# Patient Record
Sex: Female | Born: 1950 | Race: White | Hispanic: No | Smoking: Never smoker
Health system: Southern US, Community
[De-identification: ages and names within clinical notes are randomized; demographics above are authoritative.]

## PROBLEM LIST (undated history)

## (undated) DIAGNOSIS — M1990 Unspecified osteoarthritis, unspecified site: Secondary | ICD-10-CM

## (undated) DIAGNOSIS — D649 Anemia, unspecified: Secondary | ICD-10-CM

## (undated) DIAGNOSIS — F329 Major depressive disorder, single episode, unspecified: Secondary | ICD-10-CM

## (undated) DIAGNOSIS — F419 Anxiety disorder, unspecified: Secondary | ICD-10-CM

## (undated) DIAGNOSIS — J45909 Unspecified asthma, uncomplicated: Secondary | ICD-10-CM

## (undated) DIAGNOSIS — C801 Malignant (primary) neoplasm, unspecified: Secondary | ICD-10-CM

## (undated) DIAGNOSIS — G4730 Sleep apnea, unspecified: Secondary | ICD-10-CM

## (undated) DIAGNOSIS — K529 Noninfective gastroenteritis and colitis, unspecified: Secondary | ICD-10-CM

## (undated) DIAGNOSIS — K219 Gastro-esophageal reflux disease without esophagitis: Secondary | ICD-10-CM

## (undated) DIAGNOSIS — T8859XA Other complications of anesthesia, initial encounter: Secondary | ICD-10-CM

## (undated) DIAGNOSIS — F32A Depression, unspecified: Secondary | ICD-10-CM

## (undated) DIAGNOSIS — T4145XA Adverse effect of unspecified anesthetic, initial encounter: Secondary | ICD-10-CM

## (undated) HISTORY — PX: TUBAL LIGATION: SHX77

## (undated) HISTORY — DX: Malignant (primary) neoplasm, unspecified: C80.1

## (undated) HISTORY — PX: EYE SURGERY: SHX253

## (undated) HISTORY — PX: OTHER SURGICAL HISTORY: SHX169

## (undated) HISTORY — DX: Noninfective gastroenteritis and colitis, unspecified: K52.9

---

## 2004-07-28 DIAGNOSIS — C801 Malignant (primary) neoplasm, unspecified: Secondary | ICD-10-CM

## 2004-07-28 HISTORY — DX: Malignant (primary) neoplasm, unspecified: C80.1

## 2011-09-24 DIAGNOSIS — Z8551 Personal history of malignant neoplasm of bladder: Secondary | ICD-10-CM | POA: Insufficient documentation

## 2012-04-07 DIAGNOSIS — L719 Rosacea, unspecified: Secondary | ICD-10-CM | POA: Insufficient documentation

## 2013-11-02 DIAGNOSIS — H18602 Keratoconus, unspecified, left eye: Secondary | ICD-10-CM | POA: Insufficient documentation

## 2013-11-02 DIAGNOSIS — Z961 Presence of intraocular lens: Secondary | ICD-10-CM | POA: Insufficient documentation

## 2013-11-02 DIAGNOSIS — Z947 Corneal transplant status: Secondary | ICD-10-CM | POA: Insufficient documentation

## 2013-11-02 DIAGNOSIS — H2511 Age-related nuclear cataract, right eye: Secondary | ICD-10-CM | POA: Insufficient documentation

## 2013-11-02 DIAGNOSIS — H04129 Dry eye syndrome of unspecified lacrimal gland: Secondary | ICD-10-CM | POA: Insufficient documentation

## 2014-10-07 DIAGNOSIS — Z8719 Personal history of other diseases of the digestive system: Secondary | ICD-10-CM | POA: Insufficient documentation

## 2014-10-07 DIAGNOSIS — K2960 Other gastritis without bleeding: Secondary | ICD-10-CM | POA: Insufficient documentation

## 2015-01-17 DIAGNOSIS — K2100 Gastro-esophageal reflux disease with esophagitis, without bleeding: Secondary | ICD-10-CM | POA: Insufficient documentation

## 2015-02-05 LAB — HM DEXA SCAN

## 2015-03-23 LAB — HM COLONOSCOPY

## 2015-03-30 DIAGNOSIS — K648 Other hemorrhoids: Secondary | ICD-10-CM | POA: Insufficient documentation

## 2015-07-29 HISTORY — PX: OTHER SURGICAL HISTORY: SHX169

## 2015-07-29 HISTORY — PX: JOINT REPLACEMENT: SHX530

## 2015-11-05 DIAGNOSIS — M1712 Unilateral primary osteoarthritis, left knee: Secondary | ICD-10-CM | POA: Diagnosis not present

## 2015-11-05 DIAGNOSIS — M25562 Pain in left knee: Secondary | ICD-10-CM | POA: Diagnosis not present

## 2015-11-05 DIAGNOSIS — M25561 Pain in right knee: Secondary | ICD-10-CM | POA: Diagnosis not present

## 2015-11-05 DIAGNOSIS — M7051 Other bursitis of knee, right knee: Secondary | ICD-10-CM | POA: Diagnosis not present

## 2015-11-30 DIAGNOSIS — M25561 Pain in right knee: Secondary | ICD-10-CM | POA: Diagnosis not present

## 2015-12-11 DIAGNOSIS — M47817 Spondylosis without myelopathy or radiculopathy, lumbosacral region: Secondary | ICD-10-CM | POA: Diagnosis not present

## 2015-12-11 DIAGNOSIS — Z96641 Presence of right artificial hip joint: Secondary | ICD-10-CM | POA: Diagnosis not present

## 2015-12-11 DIAGNOSIS — Z96651 Presence of right artificial knee joint: Secondary | ICD-10-CM | POA: Diagnosis not present

## 2015-12-11 DIAGNOSIS — M25561 Pain in right knee: Secondary | ICD-10-CM | POA: Diagnosis not present

## 2016-01-07 DIAGNOSIS — Z96651 Presence of right artificial knee joint: Secondary | ICD-10-CM | POA: Diagnosis not present

## 2016-01-07 DIAGNOSIS — T8484XD Pain due to internal orthopedic prosthetic devices, implants and grafts, subsequent encounter: Secondary | ICD-10-CM | POA: Diagnosis not present

## 2016-01-07 DIAGNOSIS — M25561 Pain in right knee: Secondary | ICD-10-CM | POA: Diagnosis not present

## 2016-01-17 DIAGNOSIS — J45909 Unspecified asthma, uncomplicated: Secondary | ICD-10-CM | POA: Diagnosis not present

## 2016-01-17 DIAGNOSIS — M419 Scoliosis, unspecified: Secondary | ICD-10-CM | POA: Diagnosis not present

## 2016-01-17 DIAGNOSIS — Z01818 Encounter for other preprocedural examination: Secondary | ICD-10-CM | POA: Diagnosis not present

## 2016-01-17 DIAGNOSIS — F329 Major depressive disorder, single episode, unspecified: Secondary | ICD-10-CM | POA: Diagnosis not present

## 2016-01-17 DIAGNOSIS — E669 Obesity, unspecified: Secondary | ICD-10-CM | POA: Diagnosis not present

## 2016-01-17 DIAGNOSIS — Z01812 Encounter for preprocedural laboratory examination: Secondary | ICD-10-CM | POA: Diagnosis not present

## 2016-01-17 DIAGNOSIS — G4730 Sleep apnea, unspecified: Secondary | ICD-10-CM | POA: Diagnosis not present

## 2016-01-17 DIAGNOSIS — K219 Gastro-esophageal reflux disease without esophagitis: Secondary | ICD-10-CM | POA: Diagnosis not present

## 2016-01-21 DIAGNOSIS — Z96659 Presence of unspecified artificial knee joint: Secondary | ICD-10-CM | POA: Diagnosis not present

## 2016-01-21 DIAGNOSIS — T8459XS Infection and inflammatory reaction due to other internal joint prosthesis, sequela: Secondary | ICD-10-CM | POA: Diagnosis not present

## 2016-01-21 DIAGNOSIS — J45909 Unspecified asthma, uncomplicated: Secondary | ICD-10-CM | POA: Diagnosis not present

## 2016-01-21 DIAGNOSIS — Z96651 Presence of right artificial knee joint: Secondary | ICD-10-CM | POA: Diagnosis not present

## 2016-01-21 DIAGNOSIS — T8484XD Pain due to internal orthopedic prosthetic devices, implants and grafts, subsequent encounter: Secondary | ICD-10-CM | POA: Diagnosis not present

## 2016-01-22 DIAGNOSIS — J45909 Unspecified asthma, uncomplicated: Secondary | ICD-10-CM | POA: Diagnosis present

## 2016-01-22 DIAGNOSIS — E7800 Pure hypercholesterolemia, unspecified: Secondary | ICD-10-CM | POA: Diagnosis present

## 2016-01-22 DIAGNOSIS — K210 Gastro-esophageal reflux disease with esophagitis: Secondary | ICD-10-CM | POA: Diagnosis present

## 2016-01-22 DIAGNOSIS — G4710 Hypersomnia, unspecified: Secondary | ICD-10-CM | POA: Diagnosis present

## 2016-01-22 DIAGNOSIS — Z880 Allergy status to penicillin: Secondary | ICD-10-CM | POA: Diagnosis not present

## 2016-01-22 DIAGNOSIS — E669 Obesity, unspecified: Secondary | ICD-10-CM | POA: Diagnosis present

## 2016-01-22 DIAGNOSIS — T84092A Other mechanical complication of internal right knee prosthesis, initial encounter: Secondary | ICD-10-CM | POA: Diagnosis not present

## 2016-01-22 DIAGNOSIS — Z96651 Presence of right artificial knee joint: Secondary | ICD-10-CM | POA: Diagnosis not present

## 2016-01-22 DIAGNOSIS — D509 Iron deficiency anemia, unspecified: Secondary | ICD-10-CM | POA: Diagnosis present

## 2016-01-22 DIAGNOSIS — G4733 Obstructive sleep apnea (adult) (pediatric): Secondary | ICD-10-CM | POA: Diagnosis present

## 2016-01-22 DIAGNOSIS — Z881 Allergy status to other antibiotic agents status: Secondary | ICD-10-CM | POA: Diagnosis not present

## 2016-01-22 DIAGNOSIS — Z9889 Other specified postprocedural states: Secondary | ICD-10-CM | POA: Diagnosis not present

## 2016-01-22 DIAGNOSIS — Z6832 Body mass index (BMI) 32.0-32.9, adult: Secondary | ICD-10-CM | POA: Diagnosis not present

## 2016-01-22 DIAGNOSIS — Z8551 Personal history of malignant neoplasm of bladder: Secondary | ICD-10-CM | POA: Diagnosis not present

## 2016-01-22 DIAGNOSIS — F329 Major depressive disorder, single episode, unspecified: Secondary | ICD-10-CM | POA: Diagnosis present

## 2016-01-22 DIAGNOSIS — Z471 Aftercare following joint replacement surgery: Secondary | ICD-10-CM | POA: Diagnosis not present

## 2016-01-22 DIAGNOSIS — Z79891 Long term (current) use of opiate analgesic: Secondary | ICD-10-CM | POA: Diagnosis not present

## 2016-01-22 DIAGNOSIS — Z79899 Other long term (current) drug therapy: Secondary | ICD-10-CM | POA: Diagnosis not present

## 2016-01-22 DIAGNOSIS — Z888 Allergy status to other drugs, medicaments and biological substances status: Secondary | ICD-10-CM | POA: Diagnosis not present

## 2016-01-22 DIAGNOSIS — T84032A Mechanical loosening of internal right knee prosthetic joint, initial encounter: Secondary | ICD-10-CM | POA: Diagnosis not present

## 2016-01-22 DIAGNOSIS — T8484XS Pain due to internal orthopedic prosthetic devices, implants and grafts, sequela: Secondary | ICD-10-CM | POA: Diagnosis not present

## 2016-01-26 DIAGNOSIS — J45909 Unspecified asthma, uncomplicated: Secondary | ICD-10-CM | POA: Diagnosis not present

## 2016-01-26 DIAGNOSIS — Z96651 Presence of right artificial knee joint: Secondary | ICD-10-CM | POA: Diagnosis not present

## 2016-01-26 DIAGNOSIS — E663 Overweight: Secondary | ICD-10-CM | POA: Diagnosis not present

## 2016-01-26 DIAGNOSIS — Z9181 History of falling: Secondary | ICD-10-CM | POA: Diagnosis not present

## 2016-01-26 DIAGNOSIS — Z4733 Aftercare following explantation of knee joint prosthesis: Secondary | ICD-10-CM | POA: Diagnosis not present

## 2016-01-26 DIAGNOSIS — Z6830 Body mass index (BMI) 30.0-30.9, adult: Secondary | ICD-10-CM | POA: Diagnosis not present

## 2016-01-26 DIAGNOSIS — M1712 Unilateral primary osteoarthritis, left knee: Secondary | ICD-10-CM | POA: Diagnosis not present

## 2016-01-26 DIAGNOSIS — F329 Major depressive disorder, single episode, unspecified: Secondary | ICD-10-CM | POA: Diagnosis not present

## 2016-01-26 DIAGNOSIS — Z4801 Encounter for change or removal of surgical wound dressing: Secondary | ICD-10-CM | POA: Diagnosis not present

## 2016-01-27 DIAGNOSIS — Z4801 Encounter for change or removal of surgical wound dressing: Secondary | ICD-10-CM | POA: Diagnosis not present

## 2016-01-27 DIAGNOSIS — E663 Overweight: Secondary | ICD-10-CM | POA: Diagnosis not present

## 2016-01-27 DIAGNOSIS — M1712 Unilateral primary osteoarthritis, left knee: Secondary | ICD-10-CM | POA: Diagnosis not present

## 2016-01-27 DIAGNOSIS — Z4733 Aftercare following explantation of knee joint prosthesis: Secondary | ICD-10-CM | POA: Diagnosis not present

## 2016-01-27 DIAGNOSIS — J45909 Unspecified asthma, uncomplicated: Secondary | ICD-10-CM | POA: Diagnosis not present

## 2016-01-27 DIAGNOSIS — F329 Major depressive disorder, single episode, unspecified: Secondary | ICD-10-CM | POA: Diagnosis not present

## 2016-02-01 DIAGNOSIS — Z4733 Aftercare following explantation of knee joint prosthesis: Secondary | ICD-10-CM | POA: Diagnosis not present

## 2016-02-01 DIAGNOSIS — F329 Major depressive disorder, single episode, unspecified: Secondary | ICD-10-CM | POA: Diagnosis not present

## 2016-02-01 DIAGNOSIS — J45909 Unspecified asthma, uncomplicated: Secondary | ICD-10-CM | POA: Diagnosis not present

## 2016-02-01 DIAGNOSIS — E663 Overweight: Secondary | ICD-10-CM | POA: Diagnosis not present

## 2016-02-01 DIAGNOSIS — M1712 Unilateral primary osteoarthritis, left knee: Secondary | ICD-10-CM | POA: Diagnosis not present

## 2016-02-01 DIAGNOSIS — Z4801 Encounter for change or removal of surgical wound dressing: Secondary | ICD-10-CM | POA: Diagnosis not present

## 2016-02-04 DIAGNOSIS — J45909 Unspecified asthma, uncomplicated: Secondary | ICD-10-CM | POA: Diagnosis not present

## 2016-02-04 DIAGNOSIS — E663 Overweight: Secondary | ICD-10-CM | POA: Diagnosis not present

## 2016-02-04 DIAGNOSIS — Z4801 Encounter for change or removal of surgical wound dressing: Secondary | ICD-10-CM | POA: Diagnosis not present

## 2016-02-04 DIAGNOSIS — F329 Major depressive disorder, single episode, unspecified: Secondary | ICD-10-CM | POA: Diagnosis not present

## 2016-02-04 DIAGNOSIS — Z4733 Aftercare following explantation of knee joint prosthesis: Secondary | ICD-10-CM | POA: Diagnosis not present

## 2016-02-04 DIAGNOSIS — M1712 Unilateral primary osteoarthritis, left knee: Secondary | ICD-10-CM | POA: Diagnosis not present

## 2016-02-04 DIAGNOSIS — M25561 Pain in right knee: Secondary | ICD-10-CM | POA: Diagnosis not present

## 2016-02-07 DIAGNOSIS — Z1239 Encounter for other screening for malignant neoplasm of breast: Secondary | ICD-10-CM | POA: Diagnosis not present

## 2016-02-07 DIAGNOSIS — F324 Major depressive disorder, single episode, in partial remission: Secondary | ICD-10-CM | POA: Diagnosis not present

## 2016-02-07 DIAGNOSIS — G4700 Insomnia, unspecified: Secondary | ICD-10-CM | POA: Diagnosis not present

## 2016-02-07 DIAGNOSIS — Z119 Encounter for screening for infectious and parasitic diseases, unspecified: Secondary | ICD-10-CM | POA: Diagnosis not present

## 2016-02-07 DIAGNOSIS — E7800 Pure hypercholesterolemia, unspecified: Secondary | ICD-10-CM | POA: Diagnosis not present

## 2016-02-07 DIAGNOSIS — D509 Iron deficiency anemia, unspecified: Secondary | ICD-10-CM | POA: Diagnosis not present

## 2016-02-07 DIAGNOSIS — R739 Hyperglycemia, unspecified: Secondary | ICD-10-CM | POA: Diagnosis not present

## 2016-02-07 DIAGNOSIS — T8459XS Infection and inflammatory reaction due to other internal joint prosthesis, sequela: Secondary | ICD-10-CM | POA: Diagnosis not present

## 2016-02-07 DIAGNOSIS — Z96659 Presence of unspecified artificial knee joint: Secondary | ICD-10-CM | POA: Diagnosis not present

## 2016-02-07 DIAGNOSIS — Z79899 Other long term (current) drug therapy: Secondary | ICD-10-CM | POA: Diagnosis not present

## 2016-02-08 DIAGNOSIS — Z79899 Other long term (current) drug therapy: Secondary | ICD-10-CM | POA: Diagnosis not present

## 2016-02-08 DIAGNOSIS — E663 Overweight: Secondary | ICD-10-CM | POA: Diagnosis not present

## 2016-02-08 DIAGNOSIS — D509 Iron deficiency anemia, unspecified: Secondary | ICD-10-CM | POA: Diagnosis not present

## 2016-02-08 DIAGNOSIS — Z4733 Aftercare following explantation of knee joint prosthesis: Secondary | ICD-10-CM | POA: Diagnosis not present

## 2016-02-08 DIAGNOSIS — Z4801 Encounter for change or removal of surgical wound dressing: Secondary | ICD-10-CM | POA: Diagnosis not present

## 2016-02-08 DIAGNOSIS — R739 Hyperglycemia, unspecified: Secondary | ICD-10-CM | POA: Diagnosis not present

## 2016-02-08 DIAGNOSIS — J45909 Unspecified asthma, uncomplicated: Secondary | ICD-10-CM | POA: Diagnosis not present

## 2016-02-08 DIAGNOSIS — M1712 Unilateral primary osteoarthritis, left knee: Secondary | ICD-10-CM | POA: Diagnosis not present

## 2016-02-08 DIAGNOSIS — Z119 Encounter for screening for infectious and parasitic diseases, unspecified: Secondary | ICD-10-CM | POA: Diagnosis not present

## 2016-02-08 DIAGNOSIS — F329 Major depressive disorder, single episode, unspecified: Secondary | ICD-10-CM | POA: Diagnosis not present

## 2016-02-13 DIAGNOSIS — Z96651 Presence of right artificial knee joint: Secondary | ICD-10-CM | POA: Diagnosis not present

## 2016-02-13 DIAGNOSIS — M25561 Pain in right knee: Secondary | ICD-10-CM | POA: Diagnosis not present

## 2016-02-13 DIAGNOSIS — M6281 Muscle weakness (generalized): Secondary | ICD-10-CM | POA: Diagnosis not present

## 2016-03-17 DIAGNOSIS — M25561 Pain in right knee: Secondary | ICD-10-CM | POA: Diagnosis not present

## 2016-03-17 DIAGNOSIS — Z96651 Presence of right artificial knee joint: Secondary | ICD-10-CM | POA: Diagnosis not present

## 2016-03-17 DIAGNOSIS — M1712 Unilateral primary osteoarthritis, left knee: Secondary | ICD-10-CM | POA: Diagnosis not present

## 2016-04-23 DIAGNOSIS — R21 Rash and other nonspecific skin eruption: Secondary | ICD-10-CM | POA: Diagnosis not present

## 2016-04-23 DIAGNOSIS — Z23 Encounter for immunization: Secondary | ICD-10-CM | POA: Diagnosis not present

## 2016-05-29 DIAGNOSIS — M79604 Pain in right leg: Secondary | ICD-10-CM | POA: Diagnosis not present

## 2016-05-30 DIAGNOSIS — M25562 Pain in left knee: Secondary | ICD-10-CM | POA: Diagnosis not present

## 2016-05-30 DIAGNOSIS — Z5189 Encounter for other specified aftercare: Secondary | ICD-10-CM | POA: Diagnosis not present

## 2016-05-30 DIAGNOSIS — M25561 Pain in right knee: Secondary | ICD-10-CM | POA: Diagnosis not present

## 2016-10-08 DIAGNOSIS — E611 Iron deficiency: Secondary | ICD-10-CM | POA: Diagnosis not present

## 2016-10-08 DIAGNOSIS — Z79899 Other long term (current) drug therapy: Secondary | ICD-10-CM | POA: Diagnosis not present

## 2016-10-08 DIAGNOSIS — R739 Hyperglycemia, unspecified: Secondary | ICD-10-CM | POA: Diagnosis not present

## 2016-10-08 DIAGNOSIS — E7800 Pure hypercholesterolemia, unspecified: Secondary | ICD-10-CM | POA: Diagnosis not present

## 2016-10-15 DIAGNOSIS — H9190 Unspecified hearing loss, unspecified ear: Secondary | ICD-10-CM | POA: Diagnosis not present

## 2016-10-15 DIAGNOSIS — F324 Major depressive disorder, single episode, in partial remission: Secondary | ICD-10-CM | POA: Diagnosis not present

## 2016-10-15 DIAGNOSIS — Z1231 Encounter for screening mammogram for malignant neoplasm of breast: Secondary | ICD-10-CM | POA: Diagnosis not present

## 2016-10-15 DIAGNOSIS — E7800 Pure hypercholesterolemia, unspecified: Secondary | ICD-10-CM | POA: Diagnosis not present

## 2016-10-15 DIAGNOSIS — R21 Rash and other nonspecific skin eruption: Secondary | ICD-10-CM | POA: Diagnosis not present

## 2016-10-15 DIAGNOSIS — E611 Iron deficiency: Secondary | ICD-10-CM | POA: Diagnosis not present

## 2016-10-28 DIAGNOSIS — F331 Major depressive disorder, recurrent, moderate: Secondary | ICD-10-CM | POA: Diagnosis not present

## 2016-11-12 DIAGNOSIS — F331 Major depressive disorder, recurrent, moderate: Secondary | ICD-10-CM | POA: Diagnosis not present

## 2016-11-25 DIAGNOSIS — F331 Major depressive disorder, recurrent, moderate: Secondary | ICD-10-CM | POA: Diagnosis not present

## 2016-12-01 DIAGNOSIS — Z1231 Encounter for screening mammogram for malignant neoplasm of breast: Secondary | ICD-10-CM | POA: Diagnosis not present

## 2016-12-09 DIAGNOSIS — F331 Major depressive disorder, recurrent, moderate: Secondary | ICD-10-CM | POA: Diagnosis not present

## 2016-12-25 DIAGNOSIS — Z8551 Personal history of malignant neoplasm of bladder: Secondary | ICD-10-CM | POA: Diagnosis not present

## 2016-12-25 DIAGNOSIS — Z0000 Encounter for general adult medical examination without abnormal findings: Secondary | ICD-10-CM | POA: Diagnosis not present

## 2016-12-30 DIAGNOSIS — F331 Major depressive disorder, recurrent, moderate: Secondary | ICD-10-CM | POA: Diagnosis not present

## 2017-01-08 DIAGNOSIS — F321 Major depressive disorder, single episode, moderate: Secondary | ICD-10-CM | POA: Diagnosis not present

## 2017-01-08 DIAGNOSIS — M79675 Pain in left toe(s): Secondary | ICD-10-CM | POA: Diagnosis not present

## 2017-01-08 DIAGNOSIS — M79674 Pain in right toe(s): Secondary | ICD-10-CM | POA: Diagnosis not present

## 2017-01-08 DIAGNOSIS — F331 Major depressive disorder, recurrent, moderate: Secondary | ICD-10-CM | POA: Diagnosis not present

## 2017-01-08 DIAGNOSIS — F5104 Psychophysiologic insomnia: Secondary | ICD-10-CM | POA: Diagnosis not present

## 2017-01-08 DIAGNOSIS — K591 Functional diarrhea: Secondary | ICD-10-CM | POA: Diagnosis not present

## 2017-01-29 DIAGNOSIS — L602 Onychogryphosis: Secondary | ICD-10-CM | POA: Diagnosis not present

## 2017-01-29 DIAGNOSIS — M205X2 Other deformities of toe(s) (acquired), left foot: Secondary | ICD-10-CM | POA: Diagnosis not present

## 2017-01-29 DIAGNOSIS — M2142 Flat foot [pes planus] (acquired), left foot: Secondary | ICD-10-CM | POA: Diagnosis not present

## 2017-01-29 DIAGNOSIS — R234 Changes in skin texture: Secondary | ICD-10-CM | POA: Diagnosis not present

## 2017-01-29 DIAGNOSIS — M2012 Hallux valgus (acquired), left foot: Secondary | ICD-10-CM | POA: Diagnosis not present

## 2017-01-29 DIAGNOSIS — M19072 Primary osteoarthritis, left ankle and foot: Secondary | ICD-10-CM | POA: Diagnosis not present

## 2017-01-29 DIAGNOSIS — B351 Tinea unguium: Secondary | ICD-10-CM | POA: Diagnosis not present

## 2017-01-29 DIAGNOSIS — M19079 Primary osteoarthritis, unspecified ankle and foot: Secondary | ICD-10-CM | POA: Diagnosis not present

## 2017-01-30 DIAGNOSIS — B351 Tinea unguium: Secondary | ICD-10-CM | POA: Diagnosis not present

## 2017-02-04 DIAGNOSIS — F331 Major depressive disorder, recurrent, moderate: Secondary | ICD-10-CM | POA: Diagnosis not present

## 2017-02-12 DIAGNOSIS — F331 Major depressive disorder, recurrent, moderate: Secondary | ICD-10-CM | POA: Diagnosis not present

## 2017-03-16 DIAGNOSIS — F331 Major depressive disorder, recurrent, moderate: Secondary | ICD-10-CM | POA: Diagnosis not present

## 2017-03-20 ENCOUNTER — Ambulatory Visit (INDEPENDENT_AMBULATORY_CARE_PROVIDER_SITE_OTHER): Payer: Medicare Other | Admitting: Physician Assistant

## 2017-03-20 ENCOUNTER — Encounter: Payer: Self-pay | Admitting: Physician Assistant

## 2017-03-20 VITALS — BP 126/75 | HR 102 | Resp 16 | Wt 194.8 lb

## 2017-03-20 DIAGNOSIS — F5101 Primary insomnia: Secondary | ICD-10-CM | POA: Diagnosis not present

## 2017-03-20 DIAGNOSIS — K219 Gastro-esophageal reflux disease without esophagitis: Secondary | ICD-10-CM

## 2017-03-20 DIAGNOSIS — D509 Iron deficiency anemia, unspecified: Secondary | ICD-10-CM

## 2017-03-20 DIAGNOSIS — F321 Major depressive disorder, single episode, moderate: Secondary | ICD-10-CM | POA: Diagnosis not present

## 2017-03-20 DIAGNOSIS — C678 Malignant neoplasm of overlapping sites of bladder: Secondary | ICD-10-CM | POA: Diagnosis not present

## 2017-03-20 DIAGNOSIS — R5383 Other fatigue: Secondary | ICD-10-CM | POA: Diagnosis not present

## 2017-03-20 DIAGNOSIS — R946 Abnormal results of thyroid function studies: Secondary | ICD-10-CM

## 2017-03-20 DIAGNOSIS — Z96651 Presence of right artificial knee joint: Secondary | ICD-10-CM | POA: Diagnosis not present

## 2017-03-20 DIAGNOSIS — Z23 Encounter for immunization: Secondary | ICD-10-CM | POA: Diagnosis not present

## 2017-03-20 DIAGNOSIS — Z96659 Presence of unspecified artificial knee joint: Secondary | ICD-10-CM | POA: Insufficient documentation

## 2017-03-20 DIAGNOSIS — C679 Malignant neoplasm of bladder, unspecified: Secondary | ICD-10-CM | POA: Insufficient documentation

## 2017-03-20 DIAGNOSIS — L309 Dermatitis, unspecified: Secondary | ICD-10-CM | POA: Diagnosis not present

## 2017-03-20 DIAGNOSIS — Z780 Asymptomatic menopausal state: Secondary | ICD-10-CM

## 2017-03-20 DIAGNOSIS — M79645 Pain in left finger(s): Secondary | ICD-10-CM

## 2017-03-20 DIAGNOSIS — J4520 Mild intermittent asthma, uncomplicated: Secondary | ICD-10-CM | POA: Diagnosis not present

## 2017-03-20 DIAGNOSIS — R7989 Other specified abnormal findings of blood chemistry: Secondary | ICD-10-CM

## 2017-03-20 DIAGNOSIS — R319 Hematuria, unspecified: Secondary | ICD-10-CM | POA: Insufficient documentation

## 2017-03-20 MED ORDER — ESZOPICLONE 1 MG PO TABS: ORAL_TABLET | ORAL | 0 refills | Status: DC

## 2017-03-20 MED ORDER — DICLOFENAC SODIUM 1 % TD GEL: 4.0000 g | Freq: Four times a day (QID) | TRANSDERMAL | 5 refills | Status: DC

## 2017-03-20 NOTE — Progress Notes (Signed)
Subjective:    Patient ID: Monica Page, female    DOB: December 29, 1950, 66 y.o.   MRN: [id number]  HPI  Pt is a 66 yo female who presents to the clinic to establish care.   .. Active Ambulatory Problems    Diagnosis Date Noted  . Bladder cancer (Monica Page) 03/20/2017  . Hematuria 03/20/2017  . 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 Iron deficiency anemia 03/20/2017  . Post-menopausal 03/20/2017  . Elevated TSH 03/20/2017  . No energy 03/20/2017  . Moderate major depression (Monica Page) 03/20/2017  . Mild intermittent asthma without complication 53/97/6734  . Eczema 03/20/2017  . Gastroesophageal reflux disease 03/20/2017  . Normocytic anemia 03/22/2017   Resolved Ambulatory Problems    Diagnosis Date Noted  . No Resolved Ambulatory Problems   Past Medical History:  Diagnosis Date  . Cancer (Monica Page) 07/28/2004  . Thyroid disease    .Marland Kitchen Family History  Problem Relation Age of Onset  . Alcohol abuse Mother   . Hyperlipidemia Mother   . Hypertension Mother   . Stroke Mother   . Depression Mother   . Aneurysm Mother   . Alcohol abuse Father   . Heart attack Father   . Cancer Father        lung and throat cancer  . Diabetes Maternal Grandmother   . Diabetes Maternal Grandfather    .Marland Kitchen Social History   Social History  . Marital status: Divorced    Spouse name: N/A  . Number of children: N/A  . Years of education: N/A   Occupational History  . Not on file.   Social History Main Topics  . Smoking status: Never Smoker  . Smokeless tobacco: Never Used  . Alcohol use 0.0 oz/week  . Drug use: No  . Sexual activity: No   Other Topics Concern  . Not on file   Social History Narrative  . No narrative on file   Pt had TSh checked a few months ago and was elevated. She wants rechecked to see if normalized.   Pt is having increasing problems sleeping and with depression. She has tried [place] with no relief and trazodone caused achy legs.    She is concerned with her weight she would like help losing it. She is not exercising or eating like she should.   Asthma is controlled at this time.   Hx of IDA. Would like labs to monitor.    Pt is having left thumb pain off and on. She does not remember injury. Not using anything to make better. Feels like getting weaker. Cannot take NSAIDS due to ulcer hx.     Review of Systems  All other systems reviewed and are negative.      Objective:   Physical Exam  Constitutional: She is oriented to person, place, and time. She appears well-developed and well-nourished.  HENT:  Head: Normocephalic and atraumatic.  Cardiovascular: Normal rate, regular rhythm and normal heart sounds.   Pulmonary/Chest: Effort normal and breath sounds normal.  Musculoskeletal:  Normal ROM of left thumb.  Tenderness to palpation over base of left thumb.  No bruising, redness, warmth, swelling.  Strength 5/5.   Neurological: She is alert and oriented to person, place, and time.  Psychiatric: She has a normal mood and affect. Her behavior is normal.          Assessment & Plan:  Marland KitchenMarland KitchenLesia was seen today for establish care.  Diagnoses and all orders for this visit:  Elevated TSH -     TSH  Malignant neoplasm of overlapping sites of bladder (HCC)  Status post total right knee replacement  No energy -     TSH -     CBC with Differential/Platelet  Post-menopausal -     TSH -     CBC with Differential/Platelet  Iron deficiency anemia, unspecified iron deficiency anemia type -     CBC with Differential/Platelet  Primary insomnia  Moderate major depression (HCC)  Mild intermittent asthma without complication  Gastroesophageal reflux disease, esophagitis presence not specified  Eczema, unspecified type  Encounter for immunization -     eszopiclone (LUNESTA) 1 MG TABS tablet; Take 1 to 2 tablets immediately before bedtime -     TSH -     CBC with Differential/Platelet -      diclofenac sodium (VOLTAREN) 1 % GEL; Apply 4 g topically 4 (four) times daily. -     Flu vaccine HIGH DOSE PF   .Marland Kitchen Depression screen PHQ 2/9 03/20/2017  Decreased Interest 1  Down, Depressed, Hopeless 2  PHQ - 2 Score 3  Altered sleeping 3  Tired, decreased energy 2  Change in appetite 2  Feeling bad or failure about yourself  1  Trouble concentrating 1  Moving slowly or fidgety/restless 0  Suicidal thoughts 0  PHQ-9 Score 12   persistent depression. Pt request referral to psych.  Will try lunesta for sleep. Failed trazodone. ambien not effective.   Will get labs for thyroid and IDA follow up.   Pt cannot take NSAIDs. Given gel to use for left thumb. Sounds like OA. Did not have time to evaluate today. Follow up with sports medicine.   NEEDS medicare wellness.

## 2017-03-20 NOTE — Patient Instructions (Signed)
Make appt for left thumb pain.

## 2017-03-21 LAB — CBC WITH DIFFERENTIAL/PLATELET
BASOS PCT: 1 %
Basophils Absolute: 52 cells/uL (ref 0–200)
EOS ABS: 364 {cells}/uL (ref 15–500)
EOS PCT: 7 %
HCT: 36.1 % (ref 35.0–45.0)
HEMOGLOBIN: 11.4 g/dL — AB (ref 11.7–15.5)
LYMPHS ABS: 1352 {cells}/uL (ref 850–3900)
Lymphocytes Relative: 26 %
MCH: 26.5 pg — ABNORMAL LOW (ref 27.0–33.0)
MCHC: 31.6 g/dL — ABNORMAL LOW (ref 32.0–36.0)
MCV: 83.8 fL (ref 80.0–100.0)
MONOS PCT: 10 %
MPV: 9 fL (ref 7.5–12.5)
Monocytes Absolute: 520 cells/uL (ref 200–950)
NEUTROS ABS: 2912 {cells}/uL (ref 1500–7800)
Neutrophils Relative %: 56 %
PLATELETS: 437 10*3/uL — AB (ref 140–400)
RBC: 4.31 MIL/uL (ref 3.80–5.10)
RDW: 16.4 % — ABNORMAL HIGH (ref 11.0–15.0)
WBC: 5.2 10*3/uL (ref 3.8–10.8)

## 2017-03-21 LAB — TSH: TSH: 2.19 mIU/L

## 2017-03-22 ENCOUNTER — Encounter: Payer: Self-pay | Admitting: Physician Assistant

## 2017-03-22 DIAGNOSIS — D649 Anemia, unspecified: Secondary | ICD-10-CM | POA: Insufficient documentation

## 2017-03-22 NOTE — Progress Notes (Signed)
Call pt: thyroid level normalized. Looks great. HgB at 11.4 which appears to be stable from last labs in care everywhere.

## 2017-03-23 ENCOUNTER — Encounter: Payer: Self-pay | Admitting: Physician Assistant

## 2017-03-23 ENCOUNTER — Telehealth: Payer: Self-pay

## 2017-03-23 DIAGNOSIS — M79645 Pain in left finger(s): Secondary | ICD-10-CM | POA: Insufficient documentation

## 2017-03-23 DIAGNOSIS — F321 Major depressive disorder, single episode, moderate: Secondary | ICD-10-CM

## 2017-03-23 NOTE — Telephone Encounter (Signed)
Pt called. Returned call and LVM requesting pt to call the office.

## 2017-03-25 MED ORDER — DULOXETINE HCL 60 MG PO CPEP: 60.0000 mg | ORAL_CAPSULE | Freq: Every day | ORAL | 0 refills | Status: DC

## 2017-03-25 NOTE — Telephone Encounter (Signed)
Pt returned clinic call and requested a refill on the Cymbalta Rx. Will route.

## 2017-03-25 NOTE — Telephone Encounter (Signed)
Spoke with Pt, she was to get referral from PCP to get in with [facility] in our building. Referral was placed, Pt has not been contacted yet. Per PCP, OK to send 90 day Rx to last until Pt can get scheduled. Pt advised, no further questions.

## 2017-03-25 NOTE — Telephone Encounter (Signed)
Rx sent 

## 2017-03-25 NOTE — Telephone Encounter (Signed)
I don't mind refilling this but I thought it was prescribed by your psychiatrist and I don't want to take over a medication he is controlling. You may want to call his office.

## 2017-03-25 NOTE — Addendum Note (Signed)
Addended by: Huel Cote on: 03/25/2017 03:59 PM   Modules accepted: Orders

## 2017-04-01 DIAGNOSIS — F331 Major depressive disorder, recurrent, moderate: Secondary | ICD-10-CM | POA: Diagnosis not present

## 2017-04-02 ENCOUNTER — Telehealth: Payer: Self-pay | Admitting: Physician Assistant

## 2017-04-02 DIAGNOSIS — Z96651 Presence of right artificial knee joint: Secondary | ICD-10-CM | POA: Diagnosis not present

## 2017-04-02 DIAGNOSIS — M1712 Unilateral primary osteoarthritis, left knee: Secondary | ICD-10-CM | POA: Diagnosis not present

## 2017-04-02 NOTE — Telephone Encounter (Signed)
Received fax for PA on  Eszopiclone sent through cover my meds waiting on determination. - CF

## 2017-04-16 DIAGNOSIS — L400 Psoriasis vulgaris: Secondary | ICD-10-CM | POA: Diagnosis not present

## 2017-04-16 DIAGNOSIS — L820 Inflamed seborrheic keratosis: Secondary | ICD-10-CM | POA: Diagnosis not present

## 2017-04-16 DIAGNOSIS — D485 Neoplasm of uncertain behavior of skin: Secondary | ICD-10-CM | POA: Diagnosis not present

## 2017-04-20 DIAGNOSIS — C679 Malignant neoplasm of bladder, unspecified: Secondary | ICD-10-CM | POA: Diagnosis not present

## 2017-04-22 ENCOUNTER — Encounter (HOSPITAL_COMMUNITY): Payer: Self-pay | Admitting: Psychiatry

## 2017-04-22 ENCOUNTER — Ambulatory Visit (INDEPENDENT_AMBULATORY_CARE_PROVIDER_SITE_OTHER): Payer: Medicare Other | Admitting: Psychiatry

## 2017-04-22 VITALS — BP 122/70 | HR 100 | Resp 16 | Ht 63.0 in | Wt 192.0 lb

## 2017-04-22 DIAGNOSIS — Z91410 Personal history of adult physical and sexual abuse: Secondary | ICD-10-CM

## 2017-04-22 DIAGNOSIS — F4310 Post-traumatic stress disorder, unspecified: Secondary | ICD-10-CM | POA: Diagnosis not present

## 2017-04-22 DIAGNOSIS — Z818 Family history of other mental and behavioral disorders: Secondary | ICD-10-CM

## 2017-04-22 DIAGNOSIS — F411 Generalized anxiety disorder: Secondary | ICD-10-CM | POA: Diagnosis not present

## 2017-04-22 DIAGNOSIS — G4700 Insomnia, unspecified: Secondary | ICD-10-CM

## 2017-04-22 DIAGNOSIS — Z811 Family history of alcohol abuse and dependence: Secondary | ICD-10-CM | POA: Diagnosis not present

## 2017-04-22 DIAGNOSIS — F321 Major depressive disorder, single episode, moderate: Secondary | ICD-10-CM | POA: Diagnosis not present

## 2017-04-22 DIAGNOSIS — F5101 Primary insomnia: Secondary | ICD-10-CM

## 2017-04-22 DIAGNOSIS — Z62811 Personal history of psychological abuse in childhood: Secondary | ICD-10-CM

## 2017-04-22 DIAGNOSIS — Z62810 Personal history of physical and sexual abuse in childhood: Secondary | ICD-10-CM | POA: Diagnosis not present

## 2017-04-22 MED ORDER — MIRTAZAPINE 15 MG PO TABS: 15.0000 mg | ORAL_TABLET | Freq: Every day | ORAL | 0 refills | Status: DC

## 2017-04-22 MED ORDER — DULOXETINE HCL 60 MG PO CPEP: 60.0000 mg | ORAL_CAPSULE | Freq: Two times a day (BID) | ORAL | 0 refills | Status: DC

## 2017-04-22 MED ORDER — BUPROPION HCL ER (SR) 100 MG PO TB12: 100.0000 mg | ORAL_TABLET | Freq: Every day | ORAL | 0 refills | Status: DC
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
## 2017-04-29 DIAGNOSIS — F331 Major depressive disorder, recurrent, moderate: Secondary | ICD-10-CM | POA: Diagnosis not present

## 2017-05-14 ENCOUNTER — Other Ambulatory Visit (HOSPITAL_COMMUNITY): Payer: Self-pay | Admitting: Psychiatry

## 2017-05-14 ENCOUNTER — Ambulatory Visit (INDEPENDENT_AMBULATORY_CARE_PROVIDER_SITE_OTHER): Payer: Medicare Other | Admitting: Psychiatry

## 2017-05-14 DIAGNOSIS — G4700 Insomnia, unspecified: Secondary | ICD-10-CM | POA: Diagnosis not present

## 2017-05-14 DIAGNOSIS — Z811 Family history of alcohol abuse and dependence: Secondary | ICD-10-CM | POA: Diagnosis not present

## 2017-05-14 DIAGNOSIS — F321 Major depressive disorder, single episode, moderate: Secondary | ICD-10-CM | POA: Diagnosis not present

## 2017-05-14 DIAGNOSIS — F411 Generalized anxiety disorder: Secondary | ICD-10-CM | POA: Diagnosis not present

## 2017-05-14 DIAGNOSIS — Z818 Family history of other mental and behavioral disorders: Secondary | ICD-10-CM | POA: Diagnosis not present

## 2017-05-14 DIAGNOSIS — F5101 Primary insomnia: Secondary | ICD-10-CM | POA: Diagnosis not present

## 2017-05-14 DIAGNOSIS — F4310 Post-traumatic stress disorder, unspecified: Secondary | ICD-10-CM | POA: Diagnosis not present

## 2017-05-14 MED ORDER — MIRTAZAPINE 15 MG PO TABS: 15.0000 mg | ORAL_TABLET | Freq: Every day | ORAL | 0 refills | Status: DC

## 2017-05-14 MED ORDER — DULOXETINE HCL 60 MG PO CPEP: 60.0000 mg | ORAL_CAPSULE | Freq: Two times a day (BID) | ORAL | 0 refills | Status: DC

## 2017-05-14 MED ORDER — BUPROPION HCL ER (SR) 150 MG PO TB12: 150.0000 mg | ORAL_TABLET | Freq: Every day | ORAL | 0 refills | Status: DC
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
## 2017-05-20 NOTE — Pre-Procedure Instructions (Signed)
Monica Page  05/20/2017      [facility] [place] [place] [address] [place] [address] Phone: [phone] Fax: [phone]    Your procedure is scheduled on June 01, 2017.  Report to [facility] Admitting at 530 AM.  Call this number if you have problems the morning of surgery:  [phone]   Remember:  Do not eat food or drink liquids after midnight.  Take these medicines the morning of surgery with A SIP OF WATER wellbutrin (bupropion), duloxetine (cymbalta), fexofenadine (allegra), montelukast (singulair), pantoprazole (protonix), ranitidine (zantac), ventolin inhaler-if needed (bring inhaler with you).   7 days prior to surgery STOP taking any Aspirin (unless otherwise instructed by your surgeon), Aleve, Naproxen, Ibuprofen, Motrin, Advil, Goody's, BC's, all herbal medications, fish oil, and all vitamins  Continue all other medications as instructed by your physician except follow the above medication instructions before surgery   Do not wear jewelry, make-up or nail polish.  Do not wear lotions, powders, or perfumes, or deoderant.  Do not shave 48 hours prior to surgery.  Men may shave face and neck.  Do not bring valuables to the hospital.  [facility] is not responsible for any belongings or valuables.  Contacts, dentures or bridgework may not be worn into surgery.  Leave your suitcase in the car.  After surgery it may be brought to your room.  For patients admitted to the hospital, discharge time will be determined by your treatment team.  Patients discharged the day of surgery will not be allowed to drive home.    Special instructions:   [facility]- Preparing For Surgery  Before surgery, you can play an important role. Because skin is not sterile, your skin needs to be as free of germs as possible. You can reduce the number of germs on your skin by washing with CHG  (chlorahexidine gluconate) Soap before surgery.  CHG is an antiseptic cleaner which kills germs and bonds with the skin to continue killing germs even after washing.  Please do not use if you have an allergy to CHG or antibacterial soaps. If your skin becomes reddened/irritated stop using the CHG.  Do not shave (including legs and underarms) for at least 48 hours prior to first CHG shower. It is OK to shave your face.  Please follow these instructions carefully.   1. Shower the NIGHT BEFORE SURGERY and the MORNING OF SURGERY with CHG.   2. If you chose to wash your hair, wash your hair first as usual with your normal shampoo.  3. After you shampoo, rinse your hair and body thoroughly to remove the shampoo.  4. Use CHG as you would any other liquid soap. You can apply CHG directly to the skin and wash gently with a scrungie or a clean washcloth.   5. Apply the CHG Soap to your body ONLY FROM THE NECK DOWN.  Do not use on open wounds or open sores. Avoid contact with your eyes, ears, mouth and genitals (private parts). Wash Face and genitals (private parts)  with your normal soap.  6. Wash thoroughly, paying special attention to the area where your surgery will be performed.  7. Thoroughly rinse your body with warm water from the neck down.  8. DO NOT shower/wash with your normal soap after using and rinsing off the CHG Soap.  9. Pat yourself dry with a CLEAN TOWEL.  10. Wear CLEAN PAJAMAS to bed the night before surgery, wear comfortable clothes the morning of surgery  11. Place CLEAN Ryans on your bed the night of your first shower and DO NOT SLEEP WITH PETS.    Day of Surgery: Do not apply any deodorants/lotions. Please wear clean clothes to the hospital/surgery center.     Please read over the following fact [place] that you were given. Pain Booklet, Coughing and Deep Breathing, MRSA Information and Surgical Site Infection Prevention

## 2017-05-21 ENCOUNTER — Ambulatory Visit (HOSPITAL_COMMUNITY)
Admission: RE | Admit: 2017-05-21 | Discharge: 2017-05-21 | Disposition: A | Discharge status: Home / Self Care | Payer: Medicare Other | Source: Ambulatory Visit | Attending: Orthopedic Surgery | Admitting: Orthopedic Surgery

## 2017-05-21 ENCOUNTER — Encounter (HOSPITAL_COMMUNITY)
Admission: RE | Admit: 2017-05-21 | Discharge: 2017-05-21 | Disposition: A | Discharge status: Home / Self Care | Payer: Medicare Other | Source: Ambulatory Visit | Attending: Orthopedic Surgery | Admitting: Orthopedic Surgery

## 2017-05-21 ENCOUNTER — Encounter (HOSPITAL_COMMUNITY): Payer: Self-pay

## 2017-05-21 DIAGNOSIS — F419 Anxiety disorder, unspecified: Secondary | ICD-10-CM | POA: Insufficient documentation

## 2017-05-21 DIAGNOSIS — K219 Gastro-esophageal reflux disease without esophagitis: Secondary | ICD-10-CM | POA: Insufficient documentation

## 2017-05-21 DIAGNOSIS — Z01818 Encounter for other preprocedural examination: Secondary | ICD-10-CM

## 2017-05-21 DIAGNOSIS — M1712 Unilateral primary osteoarthritis, left knee: Secondary | ICD-10-CM | POA: Insufficient documentation

## 2017-05-21 DIAGNOSIS — Z96651 Presence of right artificial knee joint: Secondary | ICD-10-CM | POA: Diagnosis not present

## 2017-05-21 DIAGNOSIS — D509 Iron deficiency anemia, unspecified: Secondary | ICD-10-CM | POA: Diagnosis not present

## 2017-05-21 DIAGNOSIS — Z01810 Encounter for preprocedural cardiovascular examination: Secondary | ICD-10-CM | POA: Diagnosis not present

## 2017-05-21 DIAGNOSIS — D649 Anemia, unspecified: Secondary | ICD-10-CM | POA: Diagnosis not present

## 2017-05-21 DIAGNOSIS — Z471 Aftercare following joint replacement surgery: Secondary | ICD-10-CM | POA: Diagnosis not present

## 2017-05-21 DIAGNOSIS — Z96652 Presence of left artificial knee joint: Secondary | ICD-10-CM | POA: Diagnosis not present

## 2017-05-21 DIAGNOSIS — G4730 Sleep apnea, unspecified: Secondary | ICD-10-CM | POA: Diagnosis not present

## 2017-05-21 DIAGNOSIS — Z79899 Other long term (current) drug therapy: Secondary | ICD-10-CM | POA: Diagnosis not present

## 2017-05-21 DIAGNOSIS — F329 Major depressive disorder, single episode, unspecified: Secondary | ICD-10-CM | POA: Diagnosis not present

## 2017-05-21 HISTORY — DX: Anxiety disorder, unspecified: F41.9

## 2017-05-21 HISTORY — DX: Depression, unspecified: F32.A

## 2017-05-21 HISTORY — DX: Sleep apnea, unspecified: G47.30

## 2017-05-21 HISTORY — DX: Unspecified osteoarthritis, unspecified site: M19.90

## 2017-05-21 HISTORY — DX: Other complications of anesthesia, initial encounter: T88.59XA

## 2017-05-21 HISTORY — DX: Anemia, unspecified: D64.9

## 2017-05-21 HISTORY — DX: Major depressive disorder, single episode, unspecified: F32.9

## 2017-05-21 HISTORY — DX: Adverse effect of unspecified anesthetic, initial encounter: T41.45XA

## 2017-05-21 HISTORY — DX: Gastro-esophageal reflux disease without esophagitis: K21.9

## 2017-05-21 LAB — PROTIME-INR
INR: 1.08
Prothrombin Time: 13.9 seconds (ref 11.4–15.2)

## 2017-05-21 LAB — COMPREHENSIVE METABOLIC PANEL
ALBUMIN: 3.8 g/dL (ref 3.5–5.0)
ALK PHOS: 101 U/L (ref 38–126)
ALT: 16 U/L (ref 14–54)
AST: 24 U/L (ref 15–41)
Anion gap: 7 (ref 5–15)
BILIRUBIN TOTAL: 0.5 mg/dL (ref 0.3–1.2)
BUN: 10 mg/dL (ref 6–20)
CALCIUM: 8.7 mg/dL — AB (ref 8.9–10.3)
CO2: 23 mmol/L (ref 22–32)
CREATININE: 1 mg/dL (ref 0.44–1.00)
Chloride: 106 mmol/L (ref 101–111)
GFR calc Af Amer: >60 mL/min (ref >60)
GFR calc non Af Amer: 57 mL/min — ABNORMAL LOW (ref >60)
GLUCOSE: 96 mg/dL (ref 65–99)
Potassium: 4 mmol/L (ref 3.5–5.1)
SODIUM: 136 mmol/L (ref 135–145)
Total Protein: 6.4 g/dL — ABNORMAL LOW (ref 6.5–8.1)

## 2017-05-21 LAB — URINALYSIS, ROUTINE W REFLEX MICROSCOPIC
BILIRUBIN URINE: NEGATIVE
GLUCOSE, UA: NEGATIVE mg/dL
Hgb urine dipstick: NEGATIVE
KETONES UR: NEGATIVE mg/dL
Nitrite: NEGATIVE
PH: 5 (ref 5.0–8.0)
Protein, ur: NEGATIVE mg/dL
SPECIFIC GRAVITY, URINE: 1.021 (ref 1.005–1.030)

## 2017-05-21 LAB — SURGICAL PCR SCREEN
MRSA, PCR: NEGATIVE
Staphylococcus aureus: POSITIVE — AB

## 2017-05-21 LAB — CBC WITH DIFFERENTIAL/PLATELET
BASOS PCT: 1 %
Basophils Absolute: 0.1 10*3/uL (ref 0.0–0.1)
EOS ABS: 0.6 10*3/uL (ref 0.0–0.7)
Eosinophils Relative: 10 %
HEMATOCRIT: 36 % (ref 36.0–46.0)
HEMOGLOBIN: 11.4 g/dL — AB (ref 12.0–15.0)
LYMPHS ABS: 1.6 10*3/uL (ref 0.7–4.0)
Lymphocytes Relative: 28 %
MCH: 25.7 pg — ABNORMAL LOW (ref 26.0–34.0)
MCHC: 31.7 g/dL (ref 30.0–36.0)
MCV: 81.3 fL (ref 78.0–100.0)
Monocytes Absolute: 0.5 10*3/uL (ref 0.1–1.0)
Monocytes Relative: 9 %
NEUTROS ABS: 3 10*3/uL (ref 1.7–7.7)
NEUTROS PCT: 52 %
Platelets: 372 10*3/uL (ref 150–400)
RBC: 4.43 MIL/uL (ref 3.87–5.11)
RDW: 16.9 % — ABNORMAL HIGH (ref 11.5–15.5)
WBC: 5.7 10*3/uL (ref 4.0–10.5)

## 2017-05-21 LAB — TYPE AND SCREEN
ABO/RH(D): A POS
Antibody Screen: NEGATIVE

## 2017-05-21 LAB — ABO/RH: ABO/RH(D): A POS

## 2017-05-21 LAB — APTT: aPTT: 30 seconds (ref 24–36)

## 2017-05-21 NOTE — Progress Notes (Addendum)
Sleep Study requested from Dr. Jerrye Bushy , [place]   336  [phone]   Notified pt. Of results of PCR. Rx. Call to [facility]

## 2017-05-26 DIAGNOSIS — F331 Major depressive disorder, recurrent, moderate: Secondary | ICD-10-CM | POA: Diagnosis not present

## 2017-05-27 ENCOUNTER — Other Ambulatory Visit: Payer: Self-pay | Admitting: *Deleted

## 2017-05-27 DIAGNOSIS — K219 Gastro-esophageal reflux disease without esophagitis: Secondary | ICD-10-CM

## 2017-05-27 MED ORDER — RANITIDINE HCL 150 MG PO TABS: ORAL_TABLET | ORAL | 3 refills | Status: DC

## 2017-05-28 ENCOUNTER — Other Ambulatory Visit (HOSPITAL_COMMUNITY): Payer: Self-pay | Admitting: Psychiatry

## 2017-05-28 ENCOUNTER — Other Ambulatory Visit: Payer: Self-pay | Admitting: Orthopedic Surgery

## 2017-05-29 DIAGNOSIS — M1712 Unilateral primary osteoarthritis, left knee: Secondary | ICD-10-CM | POA: Diagnosis present

## 2017-05-29 MED ORDER — LACTATED RINGERS IV SOLN: INTRAVENOUS | Status: DC

## 2017-05-29 MED ORDER — SODIUM CHLORIDE 0.9 % IV SOLN
2000.0000 mg | INTRAVENOUS | Status: AC
  Administered 2017-06-01: 2000 mg via TOPICAL
  Filled 2017-05-29: qty 20

## 2017-05-29 NOTE — H&P (Signed)
TOTAL KNEE ADMISSION H&P  Patient is being admitted for left total knee arthroplasty.  Subjective:  Chief Complaint:left knee pain.  HPI: Monica Page, 66 y.o. female, has a history of pain and functional disability in the left knee due to arthritis and has failed non-surgical conservative treatments for greater than 12 weeks to includeNSAID's and/or analgesics, use of assistive devices, weight reduction as appropriate and activity modification.  Onset of symptoms was gradual, starting 1 years ago with gradually worsening course since that time. The patient noted no past surgery on the left knee(s).  Patient currently rates pain in the left knee(s) at 10 out of 10 with activity. Patient has night pain, worsening of pain with activity and weight bearing, pain that interferes with activities of daily living and pain with passive range of motion.  Patient has evidence of subchondral sclerosis, periarticular osteophytes and joint space narrowing by imaging studies.   There is no active infection.  Patient Active Problem List   Diagnosis Date Noted  . Pain of left thumb 03/23/2017  . Normocytic anemia 03/22/2017  . Bladder cancer ([place]) 03/20/2017  . Hematuria 03/20/2017  . 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 Iron deficiency anemia 03/20/2017  . Post-menopausal 03/20/2017  . Elevated TSH 03/20/2017  . No energy 03/20/2017  . 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 Mild intermittent asthma without complication 22/97/9892  . Eczema 03/20/2017  . Gastroesophageal reflux disease 03/20/2017   Past Medical History:  Diagnosis Date  . Anemia    hx. of anemia  . Anxiety   . Arthritis   . 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 Complication of anesthesia    asthma attack coming out of anesthesia  . Depression   . Dyspnea    with exertion  . GERD (gastroesophageal reflux disease)   . Sleep apnea    CPAP does not use    Past Surgical  History:  Procedure Laterality Date  . broken elbow Right   . brooken toe Right   . EYE SURGERY    . infected knee relpacement Right 2017  . JOINT REPLACEMENT Right 07/29/2015   knee  . TUBAL LIGATION      Current Facility-Administered Medications  Medication Dose Route Frequency Provider Last Rate Last Dose  . [START ON 06/01/2017] lactated ringers infusion   Intravenous Continuous Frederik Pear, MD      . Derrill Memo ON 06/01/2017] tranexamic acid (CYKLOKAPRON) 2,000 mg in sodium chloride 0.9 % 50 mL Topical Application  1,194 mg Topical To OR Frederik Pear, MD       Current Outpatient Prescriptions  Medication Sig Dispense Refill Last Dose  . fexofenadine (ALLEGRA) 180 MG tablet Take 180 mg by mouth daily.    Taking  . metroNIDAZOLE (METROCREAM) 0.75 % cream Apply 1 application topically 2 (two) times daily as needed (for rosacea).   5   . montelukast (SINGULAIR) 10 MG tablet Take 10 mg by mouth at bedtime.    Taking  . Multiple Vitamins-Minerals (ONE-A-DAY WOMENS 50 PLUS PO) Take 1 tablet by mouth daily.    Taking  . naproxen sodium (ANAPROX) 220 MG tablet Take 440 mg by mouth at bedtime.     . pantoprazole (PROTONIX) 40 MG tablet Take 40 mg by mouth daily.    Taking  . Probiotic Product (PROBIOTIC DAILY PO) Take 1 capsule by mouth daily.    Taking  . traZODone (DESYREL) 50 MG tablet Take 50 mg by  mouth at bedtime.     . triamcinolone cream (KENALOG) 0.1 % Apply 1 application topically 2 (two) times daily as needed (for eczema/psoriasis).     . VENTOLIN HFA 108 (90 Base) MCG/ACT inhaler Inhale 2 puffs into the lungs every 6 (six) hours as needed for wheezing or shortness of breath.    Taking  . buPROPion (WELLBUTRIN SR) 150 MG 12 hr tablet Take 1 tablet (150 mg total) by mouth daily. 30 tablet 0   . diclofenac sodium (VOLTAREN) 1 % GEL Apply 4 g topically 4 (four) times daily. (Patient not taking: Reported on 05/12/2017) 1 Tube 5 Completed Course at Unknown time  . DULoxetine (CYMBALTA) 60 MG  capsule Take 1 capsule (60 mg total) by mouth 2 (two) times daily. 60 capsule 0   . mirtazapine (REMERON) 15 MG tablet Take 1 tablet (15 mg total) by mouth at bedtime. 30 tablet 0   . ranitidine (ZANTAC) 150 MG tablet Take 150 mg by mouth twice daily 180 tablet 3    Allergies  Allergen Reactions  . Azithromycin     UNSPECIFIED REACTION   . Doxycycline     UNSPECIFIED REACTION   . Levofloxacin     UNSPECIFIED REACTION   . Penicillins Rash    Social History  Substance Use Topics  . Smoking status: Never Smoker  . Smokeless tobacco: Never Used  . Alcohol use 0.6 oz/week    1 Glasses of wine per week     Comment: rare    Family History  Problem Relation Age of Onset  . Alcohol abuse Mother   . Hyperlipidemia Mother   . Hypertension Mother   . Stroke Mother   . Depression Mother   . Aneurysm Mother   . Alcohol abuse Father   . Heart attack Father   . Cancer Father        lung and throat cancer  . Diabetes Maternal Grandmother   . Diabetes Maternal Grandfather      Review of Systems  Constitutional: Positive for malaise/fatigue.  HENT: Positive for sinus pain.   Eyes:       Poor vision  Respiratory: Positive for shortness of breath.   Cardiovascular: Negative.   Gastrointestinal: Negative.   Genitourinary: Positive for hematuria.  Musculoskeletal: Positive for joint pain.  Skin: Positive for rash.  Neurological: Negative.   Endo/Heme/Allergies: Negative.   Psychiatric/Behavioral: Positive for depression.    Objective:  Physical Exam  Constitutional: She is oriented to person, place, and time. She appears well-developed and well-nourished.  HENT:  Head: Normocephalic and atraumatic.  Eyes: Pupils are equal, round, and reactive to light.  Neck: Normal range of motion. Neck supple.  Cardiovascular: Intact distal pulses.   Respiratory: Effort normal.  Musculoskeletal: She exhibits tenderness.  Neurological: She is alert and oriented to person, place, and time.   Skin: Skin is warm and dry.  Psychiatric: She has a normal mood and affect. Her behavior is normal. Judgment and thought content normal.    Vital signs in last 24 hours:    Labs:   Estimated body mass index is 33.99 kg/m as calculated from the following:   Height as of 05/21/17: 5\' 3"  (1.6 m).   Weight as of 05/21/17: 87 kg (191 lb 14.4 oz).   Imaging Review Plain radiographs demonstrate bilateral AP weightbearing, bilateral Rosenberg, lateral sunrise views of the left knee are taken and reviewed in office today.  This shows end-stage arthritis lateral compartment bone-on-bone and also moderate to severe  patellofemoral arthritis.  Patient's right knee has a revision with post and long femoral and tibial stems.  Assessment/Plan:  End stage arthritis, left knee   The patient history, physical examination, clinical judgment of the provider and imaging studies are consistent with end stage degenerative joint disease of the left knee(s) and total knee arthroplasty is deemed medically necessary. The treatment options including medical management, injection therapy arthroscopy and arthroplasty were discussed at length. The risks and benefits of total knee arthroplasty were presented and reviewed. The risks due to aseptic loosening, infection, stiffness, patella tracking problems, thromboembolic complications and other imponderables were discussed. The patient acknowledged the explanation, agreed to proceed with the plan and consent was signed. Patient is being admitted for inpatient treatment for surgery, pain control, PT, OT, prophylactic antibiotics, VTE prophylaxis, progressive ambulation and ADL's and discharge planning. The patient is planning to be discharged home with home health services.

## 2017-06-01 ENCOUNTER — Encounter (HOSPITAL_COMMUNITY)
Admission: RE | Disposition: A | Discharge status: Home / Self Care | Payer: Self-pay | Source: Ambulatory Visit | Attending: Orthopedic Surgery

## 2017-06-01 ENCOUNTER — Observation Stay (HOSPITAL_COMMUNITY)
Admission: RE | Admit: 2017-06-01 | Discharge: 2017-06-02 | Disposition: A | Discharge status: Home / Self Care | Payer: Medicare Other | Source: Ambulatory Visit | Attending: Orthopedic Surgery | Admitting: Orthopedic Surgery

## 2017-06-01 ENCOUNTER — Other Ambulatory Visit: Payer: Self-pay

## 2017-06-01 ENCOUNTER — Inpatient Hospital Stay (HOSPITAL_COMMUNITY): Payer: Medicare Other | Admitting: Certified Registered Nurse Anesthetist

## 2017-06-01 ENCOUNTER — Encounter (HOSPITAL_COMMUNITY): Payer: Self-pay

## 2017-06-01 DIAGNOSIS — Z833 Family history of diabetes mellitus: Secondary | ICD-10-CM | POA: Diagnosis not present

## 2017-06-01 DIAGNOSIS — J4520 Mild intermittent asthma, uncomplicated: Secondary | ICD-10-CM | POA: Insufficient documentation

## 2017-06-01 DIAGNOSIS — Z9851 Tubal ligation status: Secondary | ICD-10-CM | POA: Insufficient documentation

## 2017-06-01 DIAGNOSIS — Z811 Family history of alcohol abuse and dependence: Secondary | ICD-10-CM | POA: Insufficient documentation

## 2017-06-01 DIAGNOSIS — Z7951 Long term (current) use of inhaled steroids: Secondary | ICD-10-CM | POA: Diagnosis not present

## 2017-06-01 DIAGNOSIS — Z791 Long term (current) use of non-steroidal anti-inflammatories (NSAID): Secondary | ICD-10-CM | POA: Insufficient documentation

## 2017-06-01 DIAGNOSIS — Z9889 Other specified postprocedural states: Secondary | ICD-10-CM | POA: Insufficient documentation

## 2017-06-01 DIAGNOSIS — G4730 Sleep apnea, unspecified: Secondary | ICD-10-CM | POA: Insufficient documentation

## 2017-06-01 DIAGNOSIS — Z823 Family history of stroke: Secondary | ICD-10-CM | POA: Insufficient documentation

## 2017-06-01 DIAGNOSIS — C679 Malignant neoplasm of bladder, unspecified: Secondary | ICD-10-CM | POA: Insufficient documentation

## 2017-06-01 DIAGNOSIS — F329 Major depressive disorder, single episode, unspecified: Secondary | ICD-10-CM | POA: Diagnosis not present

## 2017-06-01 DIAGNOSIS — Z800 Family history of malignant neoplasm of digestive organs: Secondary | ICD-10-CM | POA: Insufficient documentation

## 2017-06-01 DIAGNOSIS — F5101 Primary insomnia: Secondary | ICD-10-CM | POA: Diagnosis not present

## 2017-06-01 DIAGNOSIS — K219 Gastro-esophageal reflux disease without esophagitis: Secondary | ICD-10-CM | POA: Insufficient documentation

## 2017-06-01 DIAGNOSIS — Z8349 Family history of other endocrine, nutritional and metabolic diseases: Secondary | ICD-10-CM | POA: Diagnosis not present

## 2017-06-01 DIAGNOSIS — Z880 Allergy status to penicillin: Secondary | ICD-10-CM | POA: Diagnosis not present

## 2017-06-01 DIAGNOSIS — Z801 Family history of malignant neoplasm of trachea, bronchus and lung: Secondary | ICD-10-CM | POA: Insufficient documentation

## 2017-06-01 DIAGNOSIS — Z79899 Other long term (current) drug therapy: Secondary | ICD-10-CM | POA: Diagnosis not present

## 2017-06-01 DIAGNOSIS — Z881 Allergy status to other antibiotic agents status: Secondary | ICD-10-CM | POA: Diagnosis not present

## 2017-06-01 DIAGNOSIS — M1712 Unilateral primary osteoarthritis, left knee: Principal | ICD-10-CM | POA: Insufficient documentation

## 2017-06-01 DIAGNOSIS — Z96652 Presence of left artificial knee joint: Secondary | ICD-10-CM | POA: Insufficient documentation

## 2017-06-01 DIAGNOSIS — F419 Anxiety disorder, unspecified: Secondary | ICD-10-CM | POA: Diagnosis not present

## 2017-06-01 DIAGNOSIS — G8918 Other acute postprocedural pain: Secondary | ICD-10-CM | POA: Diagnosis not present

## 2017-06-01 DIAGNOSIS — J45909 Unspecified asthma, uncomplicated: Secondary | ICD-10-CM | POA: Diagnosis not present

## 2017-06-01 HISTORY — PX: TOTAL KNEE ARTHROPLASTY: SHX125

## 2017-06-01 HISTORY — DX: Unspecified asthma, uncomplicated: J45.909

## 2017-06-01 SURGERY — ARTHROPLASTY, KNEE, TOTAL
Anesthesia: Spinal | Laterality: Left

## 2017-06-01 MED ORDER — LACTATED RINGERS IV SOLN
INTRAVENOUS | Status: DC | PRN
  Administered 2017-06-01 (×2): via INTRAVENOUS

## 2017-06-01 MED ORDER — FENTANYL CITRATE (PF) 250 MCG/5ML IJ SOLN
INTRAMUSCULAR | Status: DC | PRN
  Administered 2017-06-01: 50 ug via INTRAVENOUS
  Administered 2017-06-01 (×4): 25 ug via INTRAVENOUS
  Administered 2017-06-01: 50 ug via INTRAVENOUS

## 2017-06-01 MED ORDER — ONDANSETRON HCL 4 MG PO TABS: 4.0000 mg | ORAL_TABLET | Freq: Four times a day (QID) | ORAL | Status: DC | PRN

## 2017-06-01 MED ORDER — ONDANSETRON HCL 4 MG/2ML IJ SOLN
4.0000 mg | Freq: Four times a day (QID) | INTRAMUSCULAR | Status: DC | PRN
  Administered 2017-06-02: 4 mg via INTRAVENOUS
  Filled 2017-06-01: qty 2

## 2017-06-01 MED ORDER — GABAPENTIN 300 MG PO CAPS
300.0000 mg | ORAL_CAPSULE | Freq: Three times a day (TID) | ORAL | Status: DC
  Administered 2017-06-01 – 2017-06-02 (×4): 300 mg via ORAL
  Filled 2017-06-01 (×4): qty 1

## 2017-06-01 MED ORDER — MIDAZOLAM HCL 2 MG/2ML IJ SOLN
INTRAMUSCULAR | Status: AC
  Filled 2017-06-01: qty 2

## 2017-06-01 MED ORDER — BISACODYL 5 MG PO TBEC: 5.0000 mg | DELAYED_RELEASE_TABLET | Freq: Every day | ORAL | Status: DC | PRN

## 2017-06-01 MED ORDER — DEXAMETHASONE SODIUM PHOSPHATE 10 MG/ML IJ SOLN
10.0000 mg | Freq: Once | INTRAMUSCULAR | Status: AC
  Administered 2017-06-02: 10 mg via INTRAVENOUS
  Filled 2017-06-01: qty 1

## 2017-06-01 MED ORDER — 0.9 % SODIUM CHLORIDE (POUR BTL) OPTIME
TOPICAL | Status: DC | PRN
  Administered 2017-06-01: 1000 mL

## 2017-06-01 MED ORDER — METHOCARBAMOL 500 MG PO TABS
500.0000 mg | ORAL_TABLET | Freq: Four times a day (QID) | ORAL | Status: DC | PRN
  Administered 2017-06-01 – 2017-06-02 (×4): 500 mg via ORAL
  Filled 2017-06-01 (×3): qty 1

## 2017-06-01 MED ORDER — MENTHOL 3 MG MT LOZG: 1.0000 | LOZENGE | OROMUCOSAL | Status: DC | PRN

## 2017-06-01 MED ORDER — PROMETHAZINE HCL 25 MG/ML IJ SOLN: 6.2500 mg | INTRAMUSCULAR | Status: DC | PRN

## 2017-06-01 MED ORDER — BUPIVACAINE-EPINEPHRINE (PF) 0.25% -1:200000 IJ SOLN
INTRAMUSCULAR | Status: AC
  Filled 2017-06-01: qty 60

## 2017-06-01 MED ORDER — MIRTAZAPINE 15 MG PO TABS
15.0000 mg | ORAL_TABLET | Freq: Every day | ORAL | Status: DC
  Administered 2017-06-01: 15 mg via ORAL
  Filled 2017-06-01: qty 1

## 2017-06-01 MED ORDER — CELECOXIB 200 MG PO CAPS
200.0000 mg | ORAL_CAPSULE | Freq: Two times a day (BID) | ORAL | Status: DC
  Administered 2017-06-01 – 2017-06-02 (×3): 200 mg via ORAL
  Filled 2017-06-01 (×2): qty 1

## 2017-06-01 MED ORDER — PROPOFOL 10 MG/ML IV BOLUS
INTRAVENOUS | Status: DC | PRN
  Administered 2017-06-01: 10 mg via INTRAVENOUS
  Administered 2017-06-01 (×3): 20 mg via INTRAVENOUS

## 2017-06-01 MED ORDER — DOCUSATE SODIUM 100 MG PO CAPS
100.0000 mg | ORAL_CAPSULE | Freq: Two times a day (BID) | ORAL | Status: DC
  Administered 2017-06-01 – 2017-06-02 (×3): 100 mg via ORAL
  Filled 2017-06-01 (×3): qty 1

## 2017-06-01 MED ORDER — METOCLOPRAMIDE HCL 5 MG PO TABS: 5.0000 mg | ORAL_TABLET | Freq: Three times a day (TID) | ORAL | Status: DC | PRN

## 2017-06-01 MED ORDER — BUPIVACAINE IN DEXTROSE 0.75-8.25 % IT SOLN
INTRATHECAL | Status: DC | PRN
  Administered 2017-06-01: 1.8 mL via INTRATHECAL

## 2017-06-01 MED ORDER — OXYCODONE HCL 5 MG PO TABS: 5.0000 mg | ORAL_TABLET | ORAL | Status: DC | PRN

## 2017-06-01 MED ORDER — ACETAMINOPHEN 650 MG RE SUPP: 650.0000 mg | RECTAL | Status: DC | PRN

## 2017-06-01 MED ORDER — FENTANYL CITRATE (PF) 250 MCG/5ML IJ SOLN
INTRAMUSCULAR | Status: AC
  Filled 2017-06-01: qty 5

## 2017-06-01 MED ORDER — TRIAMCINOLONE ACETONIDE 0.1 % EX CREA: 1.0000 "application " | TOPICAL_CREAM | Freq: Two times a day (BID) | CUTANEOUS | Status: DC | PRN

## 2017-06-01 MED ORDER — ALUM & MAG HYDROXIDE-SIMETH 200-200-20 MG/5ML PO SUSP: 30.0000 mL | ORAL | Status: DC | PRN

## 2017-06-01 MED ORDER — HYDROMORPHONE HCL 1 MG/ML IJ SOLN
0.5000 mg | INTRAMUSCULAR | Status: DC | PRN
  Administered 2017-06-01 – 2017-06-02 (×4): 0.5 mg via INTRAVENOUS
  Filled 2017-06-01 (×4): qty 1

## 2017-06-01 MED ORDER — METOCLOPRAMIDE HCL 5 MG/ML IJ SOLN: 5.0000 mg | Freq: Three times a day (TID) | INTRAMUSCULAR | Status: DC | PRN

## 2017-06-01 MED ORDER — ONDANSETRON HCL 4 MG/2ML IJ SOLN
INTRAMUSCULAR | Status: DC | PRN
  Administered 2017-06-01: 4 mg via INTRAVENOUS

## 2017-06-01 MED ORDER — HYDROMORPHONE HCL 1 MG/ML IJ SOLN: 0.2500 mg | INTRAMUSCULAR | Status: DC | PRN

## 2017-06-01 MED ORDER — MIDAZOLAM HCL 2 MG/2ML IJ SOLN
INTRAMUSCULAR | Status: DC | PRN
  Administered 2017-06-01 (×2): 1 mg via INTRAVENOUS

## 2017-06-01 MED ORDER — OXYCODONE-ACETAMINOPHEN 5-325 MG PO TABS: 1.0000 | ORAL_TABLET | ORAL | 0 refills | Status: DC | PRN

## 2017-06-01 MED ORDER — FLEET ENEMA 7-19 GM/118ML RE ENEM: 1.0000 | ENEMA | Freq: Once | RECTAL | Status: DC | PRN

## 2017-06-01 MED ORDER — TRANEXAMIC ACID 1000 MG/10ML IV SOLN
1000.0000 mg | Freq: Once | INTRAVENOUS | Status: AC
  Administered 2017-06-01: 1000 mg via INTRAVENOUS
  Filled 2017-06-01: qty 10

## 2017-06-01 MED ORDER — ROPIVACAINE HCL 7.5 MG/ML IJ SOLN
INTRAMUSCULAR | Status: DC | PRN
  Administered 2017-06-01: 20 mL via PERINEURAL

## 2017-06-01 MED ORDER — SENNOSIDES-DOCUSATE SODIUM 8.6-50 MG PO TABS: 1.0000 | ORAL_TABLET | Freq: Every evening | ORAL | Status: DC | PRN

## 2017-06-01 MED ORDER — LORATADINE 10 MG PO TABS
10.0000 mg | ORAL_TABLET | Freq: Every day | ORAL | Status: DC
  Administered 2017-06-02: 10 mg via ORAL
  Filled 2017-06-01: qty 1

## 2017-06-01 MED ORDER — DULOXETINE HCL 60 MG PO CPEP
60.0000 mg | ORAL_CAPSULE | Freq: Two times a day (BID) | ORAL | Status: DC
  Administered 2017-06-02: 60 mg via ORAL
  Filled 2017-06-01 (×2): qty 1

## 2017-06-01 MED ORDER — BUPIVACAINE-EPINEPHRINE (PF) 0.25% -1:200000 IJ SOLN
INTRAMUSCULAR | Status: DC | PRN
  Administered 2017-06-01: 30 mL
  Administered 2017-06-01: 20 mL

## 2017-06-01 MED ORDER — TIZANIDINE HCL 2 MG PO TABS: 2.0000 mg | ORAL_TABLET | Freq: Four times a day (QID) | ORAL | 0 refills | Status: DC | PRN

## 2017-06-01 MED ORDER — OXYCODONE HCL 5 MG PO TABS
10.0000 mg | ORAL_TABLET | ORAL | Status: DC | PRN
  Administered 2017-06-01 – 2017-06-02 (×7): 10 mg via ORAL
  Filled 2017-06-01 (×7): qty 2

## 2017-06-01 MED ORDER — ASPIRIN EC 325 MG PO TBEC
325.0000 mg | DELAYED_RELEASE_TABLET | Freq: Every day | ORAL | Status: DC
  Administered 2017-06-02: 325 mg via ORAL
  Filled 2017-06-01: qty 1

## 2017-06-01 MED ORDER — FAMOTIDINE 20 MG PO TABS
20.0000 mg | ORAL_TABLET | Freq: Two times a day (BID) | ORAL | Status: DC
  Administered 2017-06-01 – 2017-06-02 (×2): 20 mg via ORAL
  Filled 2017-06-01 (×2): qty 1

## 2017-06-01 MED ORDER — PANTOPRAZOLE SODIUM 40 MG PO TBEC
40.0000 mg | DELAYED_RELEASE_TABLET | Freq: Every day | ORAL | Status: DC
  Administered 2017-06-02: 40 mg via ORAL
  Filled 2017-06-01: qty 1

## 2017-06-01 MED ORDER — ASPIRIN EC 325 MG PO TBEC: 325.0000 mg | DELAYED_RELEASE_TABLET | Freq: Two times a day (BID) | ORAL | 0 refills | Status: DC

## 2017-06-01 MED ORDER — CELECOXIB 200 MG PO CAPS
ORAL_CAPSULE | ORAL | Status: AC
  Administered 2017-06-01: 200 mg via ORAL
  Filled 2017-06-01: qty 1

## 2017-06-01 MED ORDER — BUPIVACAINE LIPOSOME 1.3 % IJ SUSP
INTRAMUSCULAR | Status: DC | PRN
  Administered 2017-06-01: 20 mL

## 2017-06-01 MED ORDER — MONTELUKAST SODIUM 10 MG PO TABS
10.0000 mg | ORAL_TABLET | Freq: Every day | ORAL | Status: DC
  Administered 2017-06-01: 10 mg via ORAL
  Filled 2017-06-01: qty 1

## 2017-06-01 MED ORDER — BUPIVACAINE LIPOSOME 1.3 % IJ SUSP
20.0000 mL | Freq: Once | INTRAMUSCULAR | Status: DC
  Filled 2017-06-01 (×2): qty 20

## 2017-06-01 MED ORDER — METHOCARBAMOL 500 MG PO TABS
ORAL_TABLET | ORAL | Status: AC
  Filled 2017-06-01: qty 1

## 2017-06-01 MED ORDER — PHENOL 1.4 % MT LIQD: 1.0000 | OROMUCOSAL | Status: DC | PRN

## 2017-06-01 MED ORDER — ALBUTEROL SULFATE (2.5 MG/3ML) 0.083% IN NEBU: 3.0000 mL | INHALATION_SOLUTION | Freq: Four times a day (QID) | RESPIRATORY_TRACT | Status: DC | PRN

## 2017-06-01 MED ORDER — METRONIDAZOLE 0.75 % EX CREA: 1.0000 "application " | TOPICAL_CREAM | Freq: Two times a day (BID) | CUTANEOUS | Status: DC | PRN

## 2017-06-01 MED ORDER — SODIUM CHLORIDE 0.9 % IJ SOLN
INTRAMUSCULAR | Status: DC | PRN
  Administered 2017-06-01 (×2): 10 mL

## 2017-06-01 MED ORDER — TRAZODONE HCL 50 MG PO TABS
50.0000 mg | ORAL_TABLET | Freq: Every day | ORAL | Status: DC
  Administered 2017-06-01: 50 mg via ORAL
  Filled 2017-06-01: qty 1

## 2017-06-01 MED ORDER — PROPOFOL 500 MG/50ML IV EMUL
INTRAVENOUS | Status: DC | PRN
  Administered 2017-06-01: 75 ug/kg/min via INTRAVENOUS
  Administered 2017-06-01: 100 ug/kg/min via INTRAVENOUS

## 2017-06-01 MED ORDER — KCL IN DEXTROSE-NACL 20-5-0.45 MEQ/L-%-% IV SOLN
INTRAVENOUS | Status: DC
  Administered 2017-06-01 (×2): via INTRAVENOUS
  Filled 2017-06-01 (×2): qty 1000

## 2017-06-01 MED ORDER — DIPHENHYDRAMINE HCL 12.5 MG/5ML PO ELIX: 12.5000 mg | ORAL_SOLUTION | ORAL | Status: DC | PRN

## 2017-06-01 MED ORDER — ACETAMINOPHEN 325 MG PO TABS
650.0000 mg | ORAL_TABLET | ORAL | Status: DC | PRN
  Administered 2017-06-01 – 2017-06-02 (×2): 650 mg via ORAL
  Filled 2017-06-01 (×2): qty 2

## 2017-06-01 MED ORDER — CHLORHEXIDINE GLUCONATE 4 % EX LIQD: 60.0000 mL | Freq: Once | CUTANEOUS | Status: DC

## 2017-06-01 MED ORDER — CEFAZOLIN SODIUM-DEXTROSE 2-4 GM/100ML-% IV SOLN
2.0000 g | INTRAVENOUS | Status: AC
  Administered 2017-06-01: 2 g via INTRAVENOUS
  Filled 2017-06-01: qty 100

## 2017-06-01 MED ORDER — PHENYLEPHRINE HCL 10 MG/ML IJ SOLN
INTRAVENOUS | Status: DC | PRN
  Administered 2017-06-01: 25 ug/min via INTRAVENOUS

## 2017-06-01 MED ORDER — METHOCARBAMOL 1000 MG/10ML IJ SOLN
500.0000 mg | Freq: Four times a day (QID) | INTRAVENOUS | Status: DC | PRN
  Filled 2017-06-01: qty 5

## 2017-06-01 MED ORDER — BUPROPION HCL ER (SR) 150 MG PO TB12
150.0000 mg | ORAL_TABLET | Freq: Every day | ORAL | Status: DC
  Administered 2017-06-02: 150 mg via ORAL
  Filled 2017-06-01: qty 1

## 2017-06-01 MED ORDER — TRANEXAMIC ACID 1000 MG/10ML IV SOLN
1000.0000 mg | INTRAVENOUS | Status: AC
  Administered 2017-06-01: 1000 mg via INTRAVENOUS
  Filled 2017-06-01: qty 1100

## 2017-06-01 SURGICAL SUPPLY — 51 items
BANDAGE ELASTIC 6 VELCRO ST LF (GAUZE/BANDAGES/DRESSINGS) ×2 IMPLANT
BANDAGE ESMARK 6X9 LF (GAUZE/BANDAGES/DRESSINGS) ×1 IMPLANT
BLADE SAG 18X100X1.27 (BLADE) ×2 IMPLANT
BLADE SAGITTAL 13X1.27X60 (BLADE) IMPLANT
BLADE SAW SGTL 13X75X1.27 (BLADE) IMPLANT
BNDG ELASTIC 6X10 VLCR STRL LF (GAUZE/BANDAGES/DRESSINGS) ×2 IMPLANT
BNDG ESMARK 6X9 LF (GAUZE/BANDAGES/DRESSINGS) ×2
BOWL SMART MIX CTS (DISPOSABLE) ×2 IMPLANT
CAPT KNEE TOTAL 3 ATTUNE ×2 IMPLANT
CEMENT HV SMART SET (Cement) ×4 IMPLANT
COVER SURGICAL LIGHT HANDLE (MISCELLANEOUS) ×2 IMPLANT
CUFF TOURNIQUET SINGLE 34IN LL (TOURNIQUET CUFF) ×2 IMPLANT
CUFF TOURNIQUET SINGLE 44IN (TOURNIQUET CUFF) IMPLANT
DRAPE EXTREMITY T 121X128X90 (DRAPE) ×2 IMPLANT
DRAPE U-SHAPE 47X51 STRL (DRAPES) ×2 IMPLANT
DRSG AQUACEL AG ADV 3.5X10 (GAUZE/BANDAGES/DRESSINGS) ×2 IMPLANT
DURAPREP 26ML APPLICATOR (WOUND CARE) ×2 IMPLANT
ELECT REM PT RETURN 9FT ADLT (ELECTROSURGICAL) ×2
ELECTRODE REM PT RTRN 9FT ADLT (ELECTROSURGICAL) ×1 IMPLANT
GLOVE BIO SURGEON STRL SZ7.5 (GLOVE) ×2 IMPLANT
GLOVE BIO SURGEON STRL SZ8.5 (GLOVE) ×2 IMPLANT
GLOVE BIOGEL PI IND STRL 8 (GLOVE) ×1 IMPLANT
GLOVE BIOGEL PI IND STRL 9 (GLOVE) ×1 IMPLANT
GLOVE BIOGEL PI INDICATOR 8 (GLOVE) ×1
GLOVE BIOGEL PI INDICATOR 9 (GLOVE) ×1
GOWN STRL REUS W/ TWL LRG LVL3 (GOWN DISPOSABLE) ×1 IMPLANT
GOWN STRL REUS W/ TWL XL LVL3 (GOWN DISPOSABLE) ×2 IMPLANT
GOWN STRL REUS W/TWL LRG LVL3 (GOWN DISPOSABLE) ×1
GOWN STRL REUS W/TWL XL LVL3 (GOWN DISPOSABLE) ×2
HANDPIECE INTERPULSE COAX TIP (DISPOSABLE) ×1
HOOD PEEL AWAY FACE SHEILD DIS (HOOD) ×4 IMPLANT
KIT BASIN OR (CUSTOM PROCEDURE TRAY) ×2 IMPLANT
KIT ROOM TURNOVER OR (KITS) ×2 IMPLANT
MANIFOLD NEPTUNE II (INSTRUMENTS) ×2 IMPLANT
NEEDLE 22X1 1/2 (OR ONLY) (NEEDLE) ×4 IMPLANT
NS IRRIG 1000ML POUR BTL (IV SOLUTION) ×2 IMPLANT
PACK TOTAL JOINT (CUSTOM PROCEDURE TRAY) ×2 IMPLANT
PAD ARMBOARD 7.5X6 YLW CONV (MISCELLANEOUS) ×4 IMPLANT
SET HNDPC FAN SPRY TIP SCT (DISPOSABLE) ×1 IMPLANT
SUT VIC AB 0 CT1 27 (SUTURE) ×1
SUT VIC AB 0 CT1 27XBRD ANBCTR (SUTURE) ×1 IMPLANT
SUT VIC AB 1 CTX 36 (SUTURE) ×1
SUT VIC AB 1 CTX36XBRD ANBCTR (SUTURE) ×1 IMPLANT
SUT VIC AB 2-0 CT1 27 (SUTURE) ×1
SUT VIC AB 2-0 CT1 TAPERPNT 27 (SUTURE) ×1 IMPLANT
SUT VIC AB 3-0 CT1 27 (SUTURE) ×1
SUT VIC AB 3-0 CT1 TAPERPNT 27 (SUTURE) ×1 IMPLANT
SYR CONTROL 10ML LL (SYRINGE) ×4 IMPLANT
TOWEL OR 17X24 6PK STRL BLUE (TOWEL DISPOSABLE) ×2 IMPLANT
TOWEL OR 17X26 10 PK STRL BLUE (TOWEL DISPOSABLE) ×2 IMPLANT
TRAY CATH 16FR W/PLASTIC CATH (SET/KITS/TRAYS/PACK) IMPLANT

## 2017-06-01 NOTE — Op Note (Signed)
PATIENT ID:      Monica Page  MRN:     [id number] DOB/AGE:    1950-10-18 / 66 y.o.       OPERATIVE REPORT    DATE OF PROCEDURE:  06/01/2017       PREOPERATIVE DIAGNOSIS:   LEFT KNEE OSTEOARTHRITIS      Estimated body mass index is 33.99 kg/m as calculated from the following:   Height as of 05/21/17: 5\' 3"  (1.6 m).   Weight as of 05/21/17: 87 kg (191 lb 14.4 oz).                                                        POSTOPERATIVE DIAGNOSIS:   LEFT KNEE OSTEOARTHRITIS                                                                      PROCEDURE:  Procedure(s): TOTAL KNEE ARTHROPLASTY Using DepuyAttune RP implants #5L Femur, #5Tibia, 5 mm Attune RP bearing, 38 Patella     SURGEON: Dariel Pellecchia J    ASSISTANT:   Eric K[employer]   (Present and scrubbed throughout the case, critical for assistance with exposure, retraction, instrumentation, and closure.)         ANESTHESIA: Spinsl, 20cc Exparel, 50cc 0.25% Marcaine  EBL: 300  FLUID REPLACEMENT: 1500 crystalloid  TOURNIQUET TIME: 49min  Drains: None  Tranexamic Acid: 1gm IV, 2gm topical  COMPLICATIONS:  None         INDICATIONS FOR PROCEDURE: The patient has  LEFT KNEE OSTEOARTHRITIS, Val deformities, XR shows bone on bone arthritis, lateral subluxation of tibia. Patient has failed all conservative measures including anti-inflammatory medicines, narcotics, attempts at  exercise and weight loss, cortisone injections and viscosupplementation.  Risks and benefits of surgery have been discussed, questions answered.   DESCRIPTION OF PROCEDURE: The patient identified by armband, received  IV antibiotics, in the holding area at [facility]. Patient taken to the operating room, appropriate anesthetic  monitors were attached, and Spinal anesthesia was  induced. Tourniquet  applied high to the operative thigh. Lateral post and foot positioner  applied to the table, the lower extremity was then prepped and draped  in usual  sterile fashion from the toes to the tourniquet. Time-out procedure was performed. We began the operation, with the knee flexed 120 degrees, by making the anterior midline incision starting at handbreadth above the patella going over the patella 1 cm medial to and 4 cm distal to the tibial tubercle. Small bleeders in the skin and the  subcutaneous tissue identified and cauterized. Transverse retinaculum was incised and reflected medially and a medial parapatellar arthrotomy was accomplished. the patella was everted and theprepatellar fat pad resected. The superficial medial collateral  ligament was then elevated from anterior to posterior along the proximal  flare of the tibia and anterior half of the menisci resected. The knee was hyperflexed exposing bone on bone arthritis. Peripheral and notch osteophytes as well as the cruciate ligaments were then resected. We continued to  work our way around posteriorly along the proximal tibia,  and externally  rotated the tibia subluxing it out from underneath the femur. A McHale  retractor was placed through the notch and a lateral Hohmann retractor  placed, and we then drilled through the proximal tibia in line with the  axis of the tibia followed by an intramedullary guide rod and 2-degree  posterior slope cutting guide. The tibial cutting guide, 3 degree posterior sloped, was pinned into place allowing resection of 8 mm of bone medially and 0 mm of bone laterally. Satisfied with the tibial resection, we then  entered the distal femur 2 mm anterior to the PCL origin with the  intramedullary guide rod and applied the distal femoral cutting guide  set at 9 mm, with 5 degrees of valgus. This was pinned along the  epicondylar axis. At this point, the distal femoral cut was accomplished without difficulty. We then sized for a #5L femoral component and pinned the guide in 0 degrees of external rotation. The chamfer cutting guide was pinned into place. The anterior,  posterior, and chamfer cuts were accomplished without difficulty followed by  the Attune RP box cutting guide and the box cut. We also removed posterior osteophytes from the posterior femoral condyles. At this  time, the knee was brought into full extension. We checked our  extension and flexion gaps and found them symmetric for a 5 mm bearing. Distracting in extension with a lamina spreader, the posterior horns of the menisci were removed, and Exparel, diluted to 60 cc, with 20cc NS, and 20cc 0.5% Marcaine,was injected into the capsule and synovium of the knee. The posterior patella cut was accomplished with the 9.5 mm Attune cutting guide, sized for a 51mm dome, and the fixation pegs drilled.The knee  was then once again hyperflexed exposing the proximal tibia. We sized for a # 5 tibial base plate, applied the smokestack and the conical reamer followed by the the Delta fin keel punch. We then hammered into place the Attune RP trial femoral component, drilled the lugs, inserted a  5 mm trial bearing, trial patellar button, and took the knee through range of motion from 0-130 degrees. No thumb pressure was required for patellar Tracking. At this point, the limb was wrapped with an Esmarch bandage and the tourniquet inflated to 350 mmHg. All trial components were removed, mating surfaces irrigated with pulse lavage, and dried with suction and sponges. 10 cc of the Exparel solution was applied to the cancellus bone of the patella distal femur and proximal tibia.  After waiting 1 minute, the bony surfaces were again, dried with sponges. A double batch of DePuy HV cement with 1500 mg of Zinacef was mixed and applied to all bony metallic mating surfaces except for the posterior condyles of the femur itself. In order, we hammered into place the tibial tray and removed excess cement, the femoral component and removed excess cement. The final Attune RP bearing  was inserted, and the knee brought to full extension with  compression.  The patellar button was clamped into place, and excess cement  removed. While the cement cured the wound was irrigated out with normal saline solution pulse lavage. Ligament stability and patellar tracking were checked and found to be excellent. The parapatellar arthrotomy was closed with  running #1 Vicryl suture. The subcutaneous tissue with 0 and 2-0 undyed  Vicryl suture, and the skin with running 3-0 SQ vicryl. A dressing of Xeroform,  4 x 4, dressing sponges, Webril, and Ace wrap applied. The patient  awakened, and taken  to recovery room without difficulty.   Sheyla Zaffino J 06/01/2017, 9:00 AM

## 2017-06-01 NOTE — Addendum Note (Signed)
Addendum  created 06/01/17 1105 by Bryson Corona, CRNA   Intraprocedure Flowsheets edited

## 2017-06-01 NOTE — Transfer of Care (Signed)
Immediate Anesthesia Transfer of Care Note  Patient: Monica Page  Procedure(s) Performed: TOTAL KNEE ARTHROPLASTY (Left )  Patient Location: PACU  Anesthesia Type:General  Level of Consciousness: awake, alert  and oriented  Airway & Oxygen Therapy: Patient Spontanous Breathing and Patient connected to face mask oxygen  Post-op Assessment: Report given to RN and Post -op Vital signs reviewed and stable  Post vital signs: Reviewed and stable  Last Vitals:  Vitals:   06/01/17 0600  BP: (!) 151/91  Pulse: 89  Resp: 20  Temp: 36.7 C  SpO2: 99%    Last Pain:  Vitals:   06/01/17 0649  TempSrc:   PainSc: 2       Patients Stated Pain Goal: 2 (37/16/96 7893)  Complications: No apparent anesthesia complications

## 2017-06-01 NOTE — Anesthesia Procedure Notes (Signed)
Anesthesia Procedure Image    

## 2017-06-01 NOTE — Anesthesia Postprocedure Evaluation (Signed)
Anesthesia Post Note  Patient: Charlett Merkle [place]  Procedure(s) Performed: TOTAL KNEE ARTHROPLASTY (Left )     Patient location during evaluation: PACU Anesthesia Type: Spinal Level of consciousness: oriented and awake and alert Pain management: pain level controlled Vital Signs Assessment: post-procedure vital signs reviewed and stable Respiratory status: spontaneous breathing, respiratory function stable and patient connected to nasal cannula oxygen Cardiovascular status: blood pressure returned to baseline and stable Postop Assessment: no headache, no backache and no apparent nausea or vomiting Anesthetic complications: no    Last Vitals:  Vitals:   06/01/17 1001 06/01/17 1016  BP: 120/75 125/70  Pulse: 87 91  Resp: 17 18  Temp:    SpO2: 98% 100%    Last Pain:  Vitals:   06/01/17 1000  TempSrc:   PainSc: 0-No pain    LLE Motor Response: Purposeful movement;Responds to commands (06/01/17 1015) LLE Sensation: Decreased;No pain (06/01/17 1015) RLE Motor Response: Responds to commands;Purposeful movement (06/01/17 1015) RLE Sensation: Decreased;No pain (06/01/17 1015) L Sensory Level: S1-Sole of foot, small toes (06/01/17 1015) R Sensory Level: S1-Sole of foot, small toes (06/01/17 1015)  Derward Marple S

## 2017-06-01 NOTE — Evaluation (Signed)
Physical Therapy Evaluation Patient Details Name: Monica Page MRN: [id number] DOB: 1950-08-30 Today's Date: 06/01/2017   History of Present Illness  Pt is a 66 y/o female s/p elective L TKA. PMH includes bladder cancer, OSA, and depression.   Clinical Impression  Pt is s/p surgery above with deficits below. PTA, pt was independent with functional mobility. Upon eval, pt presenting with post op pain and weakness, as well as, light headedness with ambulation. Short ambulation distance to chair this session secondary to lightheadedness. Required min guard for mobility this session. Reports friend will be staying with her at d/c and has all necessary DME. Follow up recommendations per MD arrangements. Will continue to follow acutely to maximize functional mobility independence and safety.     Follow Up Recommendations DC plan and follow up therapy as arranged by surgeon;Supervision for mobility/OOB    Equipment Recommendations  None recommended by PT    Recommendations for Other Services       Precautions / Restrictions Precautions Precautions: Knee Precaution Booklet Issued: Yes (comment) Precaution Comments: Reviewed supine knee ther ex with pt.  Restrictions Weight Bearing Restrictions: Yes LLE Weight Bearing: Weight bearing as tolerated      Mobility  Bed Mobility Overal bed mobility: Needs Assistance Bed Mobility: Supine to Sit     Supine to sit: Supervision     General bed mobility comments: Supervision for safety. Use of bed rails and elevated HOB.   Transfers Overall transfer level: Needs assistance Equipment used: Rolling walker (2 wheeled) Transfers: Sit to/from Stand Sit to Stand: Min guard         General transfer comment: Min guard for steadying assist.   Ambulation/Gait Ambulation/Gait assistance: Min guard Ambulation Distance (Feet): 5 Feet Assistive device: Rolling walker (2 wheeled) Gait Pattern/deviations: Step-to pattern;Decreased step length  - right;Decreased step length - left;Decreased weight shift to left;Antalgic Gait velocity: Decreased Gait velocity interpretation: Below normal speed for age/gender General Gait Details: Slow, antalgic gait. Verbal cues for sequencing with  RW. Pt reporting lightheadedness during gait, therefore distance limited to chair.   Stairs            Wheelchair Mobility    Modified Rankin (Stroke Patients Only)       Balance Overall balance assessment: Needs assistance Sitting-balance support: No upper extremity supported;Feet supported Sitting balance-Leahy Scale: Good     Standing balance support: Bilateral upper extremity supported;During functional activity Standing balance-Leahy Scale: Poor Standing balance comment: Reliant on UE support for balance                              Pertinent Vitals/Pain Pain Assessment: 0-10 Pain Score: 7  Pain Location: L knee  Pain Descriptors / Indicators: Aching;Operative site guarding Pain Intervention(s): Limited activity within patient's tolerance;Monitored during session;Repositioned    Home Living Family/patient expects to be discharged to:: Private residence Living Arrangements: Alone Available Help at Discharge: Friend(s);Available 24 hours/day Type of Home: House Home Access: Stairs to enter Entrance Stairs-Rails: Left Entrance Stairs-Number of Steps: 3 Home Layout: Two level;Able to live on main level with bedroom/bathroom Home Equipment: Shower seat;Walker - 2 wheels;Bedside commode      Prior Function Level of Independence: Independent               Hand Dominance   Dominant Hand: Right    Extremity/Trunk Assessment   Upper Extremity Assessment Upper Extremity Assessment: Defer to OT evaluation    Lower Extremity Assessment  Lower Extremity Assessment: LLE deficits/detail LLE Deficits / Details: Sensory in tact. Deficits consistent with post op pain and weakness. Able to perform ther ex below.      Cervical / Trunk Assessment Cervical / Trunk Assessment: Normal  Communication   Communication: No difficulties  Cognition Arousal/Alertness: Awake/alert Behavior During Therapy: WFL for tasks assessed/performed Overall Cognitive Status: Within Functional Limits for tasks assessed                                        General Comments General comments (skin integrity, edema, etc.): Pt's friend present throughout session.     Exercises Total Joint Exercises Ankle Circles/Pumps: AROM;Both;20 reps Quad Sets: AROM;Left;10 reps Towel Squeeze: AROM;Both;10 reps Short Arc Quad: AROM;Left;10 reps Heel Slides: AROM;Left;10 reps Hip ABduction/ADduction: AROM;Left;10 reps   Assessment/Plan    PT Assessment Patient needs continued PT services  PT Problem List Decreased strength;Decreased balance;Decreased mobility;Decreased knowledge of use of DME;Decreased knowledge of precautions;Pain       PT Treatment Interventions DME instruction;Gait training;Functional mobility training;Stair training;Therapeutic activities;Therapeutic exercise;Balance training;Neuromuscular re-education;Patient/family education    PT Goals (Current goals can be found in the Care Plan section)  Acute Rehab PT Goals Patient Stated Goal: to decrease pain  PT Goal Formulation: With patient Time For Goal Achievement: 06/08/17 Potential to Achieve Goals: Good    Frequency 7X/week   Barriers to discharge        Co-evaluation               AM-PAC PT "6 Clicks" Daily Activity  Outcome Measure Difficulty turning over in bed (including adjusting bedclothes, Biermann and blankets)?: None Difficulty moving from lying on back to sitting on the side of the bed? : None Difficulty sitting down on and standing up from a chair with arms (e.g., wheelchair, bedside commode, etc,.)?: Unable Help needed moving to and from a bed to chair (including a wheelchair)?: A Little Help needed walking in  hospital room?: A Little Help needed climbing 3-5 steps with a railing? : A Little 6 Click Score: 18    End of Session Equipment Utilized During Treatment: Gait belt Activity Tolerance: Other (comment)(Limited by light headedness ) Patient left: in chair;with call bell/phone within reach;with family/visitor present Nurse Communication: Mobility status;Patient requests pain meds PT Visit Diagnosis: Other abnormalities of gait and mobility (R26.89);Pain Pain - Right/Left: Left Pain - part of body: Knee    Time: 1347-1410 PT Time Calculation (min) (ACUTE ONLY): 23 min   Charges:   PT Evaluation $PT Eval Low Complexity: 1 Low PT Treatments $Therapeutic Exercise: 8-22 mins   PT G Codes:        Leighton Ruff, PT, DPT  Acute Rehabilitation Services  Pager: [phone]   Rudean Hitt 06/01/2017, 2:18 PM

## 2017-06-01 NOTE — Anesthesia Preprocedure Evaluation (Signed)
Anesthesia Evaluation  Patient identified by MRN, date of birth, ID band Patient awake    Reviewed: Allergy & Precautions, NPO status , Patient's Chart, lab work & pertinent test results  Airway Mallampati: II  TM Distance: >3 FB Neck ROM: Full    Dental no notable dental hx.    Pulmonary asthma , sleep apnea ,    Pulmonary exam normal breath sounds clear to auscultation       Cardiovascular negative cardio ROS Normal cardiovascular exam Rhythm:Regular Rate:Normal     Neuro/Psych negative neurological ROS  negative psych ROS   GI/Hepatic negative GI ROS, Neg liver ROS, GERD  ,  Endo/Other  negative endocrine ROS  Renal/GU negative Renal ROS  negative genitourinary   Musculoskeletal negative musculoskeletal ROS (+)   Abdominal   Peds negative pediatric ROS (+)  Hematology negative hematology ROS (+)   Anesthesia Other Findings   Reproductive/Obstetrics negative OB ROS                             Anesthesia Physical Anesthesia Plan  ASA: II  Anesthesia Plan: Spinal   Post-op Pain Management:  Regional for Post-op pain   Induction: Intravenous  PONV Risk Score and Plan: 2 and Ondansetron and Dexamethasone  Airway Management Planned: Simple Face Mask  Additional Equipment:   Intra-op Plan:   Post-operative Plan:   Informed Consent: I have reviewed the patients History and Physical, chart, labs and discussed the procedure including the risks, benefits and alternatives for the proposed anesthesia with the patient or authorized representative who has indicated his/her understanding and acceptance.   Dental advisory given  Plan Discussed with: CRNA and Surgeon  Anesthesia Plan Comments:         Anesthesia Quick Evaluation

## 2017-06-01 NOTE — Anesthesia Procedure Notes (Signed)
Spinal  Patient location during procedure: OR Start time: 06/01/2017 7:25 AM End time: 06/01/2017 7:35 AM Staffing Anesthesiologist: Rose, George, MD Performed: anesthesiologist  Preanesthetic Checklist Completed: patient identified, site marked, surgical consent, pre-op evaluation, timeout performed, IV checked, risks and benefits discussed and monitors and equipment checked Spinal Block Patient position: sitting Prep: Betadine Patient monitoring: heart rate, continuous pulse ox and blood pressure Injection technique: single-shot Needle Needle type: Sprotte  Needle gauge: 22 G Needle length: 9 cm Additional Notes Expiration date of kit checked and confirmed. Patient tolerated procedure well, without complications.       

## 2017-06-01 NOTE — Anesthesia Procedure Notes (Signed)
Anesthesia Regional Block: Adductor canal block   Pre-Anesthetic Checklist: ,, timeout performed, Correct Patient, Correct Site, Correct Laterality, Correct Procedure, Correct Position, site marked, Risks and benefits discussed,  Surgical consent,  Pre-op evaluation,  At surgeon's request and post-op pain management  Laterality: Left  Prep: chloraprep       Needles:  Injection technique: Single-shot  Needle Type: Echogenic Needle     Needle Length: 9cm      Additional Needles:   Procedures:,,,, ultrasound used (permanent image in chart),,,,  Narrative:  Start time: 06/01/2017 7:00 AM End time: 06/01/2017 7:12 AM Injection made incrementally with aspirations every 5 mL.  Performed by: Personally  Anesthesiologist: Myrtie Soman, MD  Additional Notes: Patient tolerated the procedure well without complications

## 2017-06-01 NOTE — Interval H&P Note (Signed)
History and Physical Interval Note:  06/01/2017 7:09 AM  Monica Page  has presented today for surgery, with the diagnosis of LEDT KNEE OSTEOARTHRITIS  The various methods of treatment have been discussed with the patient and family. After consideration of risks, benefits and other options for treatment, the patient has consented to  Procedure(s): TOTAL KNEE ARTHROPLASTY (Left) as a surgical intervention .  The patient's history has been reviewed, patient examined, no change in status, stable for surgery.  I have reviewed the patient's chart and labs.  Questions were answered to the patient's satisfaction.     Kerin Salen

## 2017-06-01 NOTE — Discharge Instructions (Signed)

## 2017-06-01 NOTE — Anesthesia Procedure Notes (Signed)
Performed by: Sammie Bench, CRNA

## 2017-06-01 NOTE — Progress Notes (Signed)
Orthopedic Tech Progress Note Patient Details:  Monica Page Oct 10, 1950 [id number]  Ortho Devices Type of Ortho Device: (footsie roll) Ortho Device/Splint Location: [name]   Hildred Priest 06/01/2017, 10:22 AM Viewed order from doctor's order list

## 2017-06-02 ENCOUNTER — Encounter (HOSPITAL_COMMUNITY): Payer: Self-pay | Admitting: Orthopedic Surgery

## 2017-06-02 DIAGNOSIS — F329 Major depressive disorder, single episode, unspecified: Secondary | ICD-10-CM | POA: Diagnosis not present

## 2017-06-02 DIAGNOSIS — G4730 Sleep apnea, unspecified: Secondary | ICD-10-CM | POA: Diagnosis not present

## 2017-06-02 DIAGNOSIS — J4520 Mild intermittent asthma, uncomplicated: Secondary | ICD-10-CM | POA: Diagnosis not present

## 2017-06-02 DIAGNOSIS — C679 Malignant neoplasm of bladder, unspecified: Secondary | ICD-10-CM | POA: Diagnosis not present

## 2017-06-02 DIAGNOSIS — K219 Gastro-esophageal reflux disease without esophagitis: Secondary | ICD-10-CM | POA: Diagnosis not present

## 2017-06-02 DIAGNOSIS — M1712 Unilateral primary osteoarthritis, left knee: Secondary | ICD-10-CM | POA: Diagnosis not present

## 2017-06-02 LAB — BASIC METABOLIC PANEL
Anion gap: 4 — ABNORMAL LOW (ref 5–15)
BUN: 8 mg/dL (ref 6–20)
CHLORIDE: 106 mmol/L (ref 101–111)
CO2: 26 mmol/L (ref 22–32)
CREATININE: 0.9 mg/dL (ref 0.44–1.00)
Calcium: 8.2 mg/dL — ABNORMAL LOW (ref 8.9–10.3)
GFR calc Af Amer: >60 mL/min (ref >60)
GFR calc non Af Amer: >60 mL/min (ref >60)
Glucose, Bld: 135 mg/dL — ABNORMAL HIGH (ref 65–99)
POTASSIUM: 4.2 mmol/L (ref 3.5–5.1)
Sodium: 136 mmol/L (ref 135–145)

## 2017-06-02 LAB — CBC
HEMATOCRIT: 30.7 % — AB (ref 36.0–46.0)
HEMOGLOBIN: 9.6 g/dL — AB (ref 12.0–15.0)
MCH: 25.7 pg — AB (ref 26.0–34.0)
MCHC: 31.3 g/dL (ref 30.0–36.0)
MCV: 82.3 fL (ref 78.0–100.0)
Platelets: 330 10*3/uL (ref 150–400)
RBC: 3.73 MIL/uL — AB (ref 3.87–5.11)
RDW: 16.6 % — ABNORMAL HIGH (ref 11.5–15.5)
WBC: 7.8 10*3/uL (ref 4.0–10.5)

## 2017-06-02 NOTE — Progress Notes (Addendum)
Physical Therapy Treatment Patient Details Name: Monica Page MRN: [id number] DOB: 1950/11/21 Today's Date: 06/02/2017    History of Present Illness Pt is a 66 y/o female s/p elective L TKA. PMH includes bladder cancer, OSA, and depression.     PT Comments    Session focused on gait and stair training. Pt demonstrating increased independence with transfers and improved activity tolerance with VSS throughout session for longer bouts of ambulation. Pt ambulated 150 feet with RW and short intermittent rest breaks due to pain. Static standing balance improved. Ascended/descended 12 stairs after education for proper sequencing with BUE support on rails. PM session will focus on reinforcing therex and stairs in preparation for d/c home this afternoon when medically cleared.     Follow Up Recommendations  DC plan and follow up therapy as arranged by surgeon;Supervision for mobility/OOB;Home health PT     Equipment Recommendations  None recommended by PT    Recommendations for Other Services       Precautions / Restrictions Precautions Precautions: Knee Precaution Booklet Issued: Yes (comment) Restrictions Weight Bearing Restrictions: Yes LLE Weight Bearing: Weight bearing as tolerated    Mobility  Bed Mobility Overal bed mobility: Needs Assistance Bed Mobility: Supine to Sit     Supine to sit: Supervision     General bed mobility comments: Supervision for safety. Use of bed rails and elevated HOB.   Transfers Overall transfer level: Needs assistance Equipment used: Rolling walker (2 wheeled) Transfers: Sit to/from Stand Sit to Stand: Supervision         General transfer comment: supervision for sit<>stand. Pt demonstrating safe hand placment and technique with walker.   Ambulation/Gait Ambulation/Gait assistance: Min guard Ambulation Distance (Feet): 150 Feet Assistive device: Rolling walker (2 wheeled) Gait Pattern/deviations: Step-through pattern;Antalgic Gait  velocity: decreased   General Gait Details: slow, antalgic gait. VC for stride length pt able to demonstrate symetrical step length   Stairs Stairs: Yes   Stair Management: Two rails;Step to pattern Number of Stairs: 12 General stair comments: cues for sequencing. pt able to perform with BUE support on rails.  3x4 stairs  Wheelchair Mobility    Modified Rankin (Stroke Patients Only)       Balance Overall balance assessment: Needs assistance Sitting-balance support: No upper extremity supported;Feet supported Sitting balance-Leahy Scale: Normal     Standing balance support: Single extremity supported;Bilateral upper extremity supported;During functional activity Standing balance-Leahy Scale: Poor Standing balance comment: Reliant on UE support for balance                             Cognition Arousal/Alertness: Awake/alert Behavior During Therapy: WFL for tasks assessed/performed Overall Cognitive Status: Within Functional Limits for tasks assessed                                        Exercises      General Comments General comments (skin integrity, edema, etc.): Pts VSS throughout session. Pain increased after stair training, requested meds from RN.       Pertinent Vitals/Pain Pain Assessment: 0-10 Pain Score: 7  Pain Location: L knee  Pain Descriptors / Indicators: Aching;Operative site guarding Pain Intervention(s): Limited activity within patient's tolerance;Monitored during session;Patient requesting pain meds-RN notified    Home Living  Prior Function            PT Goals (current goals can now be found in the care plan section) Acute Rehab PT Goals Patient Stated Goal: to return home today PT Goal Formulation: With patient Time For Goal Achievement: 06/08/17 Potential to Achieve Goals: Good    Frequency    7X/week      PT Plan Current plan remains appropriate    Co-evaluation               AM-PAC PT "6 Clicks" Daily Activity  Outcome Measure  Difficulty turning over in bed (including adjusting bedclothes, [place] and blankets)?: None Difficulty moving from lying on back to sitting on the side of the bed? : None Difficulty sitting down on and standing up from a chair with arms (e.g., wheelchair, bedside commode, etc,.)?: A Little Help needed moving to and from a bed to chair (including a wheelchair)?: A Little Help needed walking in hospital room?: A Little Help needed climbing 3-5 steps with a railing? : A Little 6 Click Score: 20    End of Session Equipment Utilized During Treatment: Gait belt Activity Tolerance: Patient limited by pain Patient left: in chair;with call bell/phone within reach;with nursing/sitter in room Nurse Communication: Mobility status;Patient requests pain meds PT Visit Diagnosis: Other abnormalities of gait and mobility (R26.89);Pain Pain - Right/Left: Left Pain - part of body: Knee     Time: 1011-1036 PT Time Calculation (min) (ACUTE ONLY): 25 min  Charges:  $Gait Training: 8-22 mins                    G Codes:    Functional Assessment Tool Used: AM-PAC 6 Clicks Basic Mobility;Clinical judgement Functional Limitation: Mobility: Walking and moving around Mobility: Walking and Moving Around Current Status (P2951): At least 20 percent but less than 40 percent impaired, limited or restricted Mobility: Walking and Moving Around Goal Status [phone]): At least 20 percent but less than 40 percent impaired, limited or restricted Mobility: Walking and Moving Around Discharge Status [phone]): At least 20 percent but less than 40 percent impaired, limited or restricted     Reinaldo Berber, PT, DPT Acute Rehab Services Pager: [place] 06/02/2017, 10:57 AM

## 2017-06-02 NOTE — Progress Notes (Signed)
Physical Therapy Treatment and Discharge Patient Details Name: Monica Page MRN: [id number] DOB: July 22, 1951 Today's Date: 06/02/2017    History of Present Illness Pt is a 66 y/o female s/p elective L TKA. PMH includes bladder cancer, OSA, and depression.     PT Comments    Session focused on reinforcing stairs and therex. Pt ascended/descneded 12 stairs with supervision, demonstrating improvement in level of independence with both gait and stairs. Pt and friend/caregiver educated on safety considerations for home and have no further questions or concerns at this time. Pt has met all functional acute goals and will benefit from skilled home health PT when medically cleared for d/c.       Follow Up Recommendations  DC plan and follow up therapy as arranged by surgeon;Supervision for mobility/OOB;Home health PT     Equipment Recommendations  None recommended by PT    Recommendations for Other Services     Precautions / Restrictions Precautions Precautions: Knee Precaution Booklet Issued: Yes (comment) Precaution Comments: verbally reviewed with Pt  Restrictions Weight Bearing Restrictions: Yes LLE Weight Bearing: Weight bearing as tolerated    Mobility  Bed Mobility Overal bed mobility: Needs Assistance Bed Mobility: Supine to Sit     Supine to sit: Supervision     General bed mobility comments: OOB in recliner upon arrival   Transfers Overall transfer level: Needs assistance Equipment used: Rolling walker (2 wheeled) Transfers: Sit to/from Stand Sit to Stand: Supervision         General transfer comment: supervision for sit<>stand. Pt demonstrating safe hand placment and technique with walker.   Ambulation/Gait Ambulation/Gait assistance: Supervision Ambulation Distance (Feet): 150 Feet Assistive device: Rolling walker (2 wheeled) Gait Pattern/deviations: Step-through pattern;Antalgic Gait velocity: decreased   General Gait Details: slow, antalgic gait.  symetrical step length.    Stairs Stairs: Yes   Stair Management: Two rails;Step to pattern Number of Stairs: 12 General stair comments: Demonstrates safe sequencing. Educated family on safe guarding for home.  Wheelchair Mobility    Modified Rankin (Stroke Patients Only)       Balance Overall balance assessment: Needs assistance Sitting-balance support: No upper extremity supported;Feet supported Sitting balance-Leahy Scale: Normal     Standing balance support: Single extremity supported;Bilateral upper extremity supported;During functional activity Standing balance-Leahy Scale: Fair Standing balance comment: Pt able to maintain static standing  without UE support                             Cognition Arousal/Alertness: Awake/alert Behavior During Therapy: WFL for tasks assessed/performed Overall Cognitive Status: Within Functional Limits for tasks assessed                                        Exercises General Exercises - Lower Extremity Ankle Circles/Pumps: AROM;10 reps Long Arc Quad: AROM;10 reps    General Comments General comments (skin integrity, edema, etc.): VSS throughout session. No increase in pain during session. Pts friend present throughout session.      Pertinent Vitals/Pain Pain Assessment: 0-10 Pain Score: 5  Faces Pain Scale: Hurts even more Pain Location: L knee  Pain Descriptors / Indicators: Aching;Operative site guarding Pain Intervention(s): Limited activity within patient's tolerance;Monitored during session;Repositioned    Home Living Family/patient expects to be discharged to:: Private residence Living Arrangements: Alone Available Help at Discharge: Friend(s);Available 24 hours/day Type of Home: House  Home Access: Stairs to enter Entrance Stairs-Rails: Left Home Layout: Two level;Able to live on main level with bedroom/bathroom Home Equipment: Shower seat;Walker - 2 wheels;Bedside commode       Prior Function Level of Independence: Independent          PT Goals (current goals can now be found in the care plan section) Acute Rehab PT Goals Patient Stated Goal: to return home today PT Goal Formulation: With patient Time For Goal Achievement: 06/08/17 Potential to Achieve Goals: Good Progress towards PT goals: Goals met/education completed, patient discharged from PT    Frequency           PT Plan Current plan remains appropriate    Co-evaluation              AM-PAC PT "6 Clicks" Daily Activity  Outcome Measure  Difficulty turning over in bed (including adjusting bedclothes, [place] and blankets)?: None Difficulty moving from lying on back to sitting on the side of the bed? : None Difficulty sitting down on and standing up from a chair with arms (e.g., wheelchair, bedside commode, etc,.)?: A Little Help needed moving to and from a bed to chair (including a wheelchair)?: A Little Help needed walking in hospital room?: A Little Help needed climbing 3-5 steps with a railing? : A Little 6 Click Score: 20    End of Session Equipment Utilized During Treatment: Gait belt Activity Tolerance: Patient limited by fatigue Patient left: in chair;with call bell/phone within reach;with nursing/sitter in room Nurse Communication: Mobility status;Patient requests pain meds PT Visit Diagnosis: Other abnormalities of gait and mobility (R26.89);Pain Pain - Right/Left: Left Pain - part of body: Knee     Time: 2979-8921 PT Time Calculation (min) (ACUTE ONLY): 32 min  Charges:  $Gait Training: 8-22 mins                    G Codes:  Functional Assessment Tool Used: AM-PAC 6 Clicks Basic Mobility;Clinical judgement Functional Limitation: Mobility: Walking and moving around Mobility: Walking and Moving Around Current Status (J9417): At least 20 percent but less than 40 percent impaired, limited or restricted Mobility: Walking and Moving Around Goal Status [phone]): At least  20 percent but less than 40 percent impaired, limited or restricted Mobility: Walking and Moving Around Discharge Status [phone]): At least 20 percent but less than 40 percent impaired, limited or restricted    Reinaldo Berber, PT, DPT Acute Rehab Services Pager: [place] 06/02/2017, 4:35 PM

## 2017-06-02 NOTE — Plan of Care (Signed)
  Progressing Education: Knowledge of General Education information will improve 06/02/2017 1020 - Progressing by Harlin Heys, [place] Behavior/Discharge Planning: Ability to manage health-related needs will improve 06/02/2017 1020 - Progressing by Harlin Heys, RN Clinical Measurements: Ability to maintain clinical measurements within normal limits will improve 06/02/2017 1020 - Progressing by Harlin Heys, RN Will remain free from infection 06/02/2017 1020 - Progressing by Harlin Heys, RN Diagnostic test results will improve 06/02/2017 1020 - Progressing by Harlin Heys, RN Respiratory complications will improve 06/02/2017 1020 - Progressing by Harlin Heys, RN Cardiovascular complication will be avoided 06/02/2017 1020 - Progressing by Harlin Heys, RN Activity: Risk for activity intolerance will decrease 06/02/2017 1020 - Progressing by Harlin Heys, RN Nutrition: Adequate nutrition will be maintained 06/02/2017 1020 - Progressing by Harlin Heys, RN Coping: Level of anxiety will decrease 06/02/2017 1020 - Progressing by Harlin Heys, RN Elimination: Will not experience complications related to bowel motility 06/02/2017 1020 - Progressing by Harlin Heys, RN Will not experience complications related to urinary retention 06/02/2017 1020 - Progressing by Harlin Heys, RN Pain Managment: General experience of comfort will improve 06/02/2017 1020 - Progressing by Harlin Heys, RN Safety: Ability to remain free from injury will improve 06/02/2017 1020 - Progressing by Harlin Heys, RN Skin Integrity: Risk for impaired skin integrity will decrease 06/02/2017 1020 - Progressing by Harlin Heys, RN

## 2017-06-02 NOTE — Progress Notes (Signed)
Pt discharged home with sister. AVS and scripts given and reviewed in full with patient and sister. All belongings sent with patient. VSS. BP 131/63 (BP Location: Left Arm)   Pulse 90   Temp 97.9 F (36.6 C) (Oral)   Resp 16   LMP 07/28/2010   SpO2 98%

## 2017-06-02 NOTE — Discharge Summary (Signed)
Patient ID: Monica Page MRN: [id number] DOB/AGE: Mar 05, 1951 66 y.o.  Admit date: 06/01/2017 Discharge date: 06/02/2017  Admission Diagnoses:  Principal Problem:   Degenerative arthritis of left knee Active Problems:   Primary osteoarthritis of left knee   Discharge Diagnoses:  Same  Past Medical History:  Diagnosis Date  . Anemia    hx. of anemia  . Anxiety   . Arthritis   . Asthma   . 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 Complication of anesthesia    asthma attack coming out of anesthesia  . Depression   . GERD (gastroesophageal reflux disease)   . Sleep apnea    CPAP does not use    Surgeries: Procedure(s): TOTAL KNEE ARTHROPLASTY on 06/01/2017   Consultants:   Discharged Condition: Improved  Hospital Course: Monica Page is an 66 y.o. female who was admitted 06/01/2017 for operative treatment ofDegenerative arthritis of left knee. Patient has severe unremitting pain that affects sleep, daily activities, and work/hobbies. After pre-op clearance the patient was taken to the operating room on 06/01/2017 and underwent  Procedure(s): TOTAL KNEE ARTHROPLASTY.    Patient was given perioperative antibiotics:  Anti-infectives (From admission, onward)   Start     Dose/Rate Route Frequency Ordered Stop   06/01/17 0625  ceFAZolin (ANCEF) IVPB 2g/100 mL premix     2 g 200 mL/hr over 30 Minutes Intravenous On call to O.R. 06/01/17 3267 06/01/17 0746       Patient was given sequential compression devices, early ambulation, and chemoprophylaxis to prevent DVT.  Patient benefited maximally from hospital stay and there were no complications.    Recent vital signs:  Patient Vitals for the past 24 hrs:  BP Temp Temp src Pulse Resp SpO2  06/02/17 0500 118/62 97.6 F (36.4 C) - (!) 116 17 95 %  06/01/17 2023 (!) 146/58 98 F (36.7 C) Oral 93 16 98 %  06/01/17 1350 133/76 (!) 97 F (36.1 C) Oral 78 20 99 %     Recent laboratory studies:  Recent Labs     06/02/17 0714  WBC 7.8  HGB 9.6*  HCT 30.7*  PLT 330  NA 136  K 4.2  CL 106  CO2 26  BUN 8  CREATININE 0.90  GLUCOSE 135*  CALCIUM 8.2*     Discharge Medications:   Allergies as of 06/02/2017      Reactions   Azithromycin    UNSPECIFIED REACTION    Doxycycline    UNSPECIFIED REACTION    Levofloxacin    UNSPECIFIED REACTION    Penicillins Rash      Medication List    STOP taking these medications   diclofenac sodium 1 % Gel Commonly known as:  VOLTAREN   naproxen sodium 220 MG tablet Commonly known as:  ALEVE     TAKE these medications   aspirin EC 325 MG tablet Take 1 tablet (325 mg total) 2 (two) times daily by mouth.   buPROPion 150 MG 12 hr tablet Commonly known as:  WELLBUTRIN SR Take 1 tablet (150 mg total) by mouth daily.   DULoxetine 60 MG capsule Commonly known as:  CYMBALTA Take 1 capsule (60 mg total) by mouth 2 (two) times daily.   fexofenadine 180 MG tablet Commonly known as:  ALLEGRA Take 180 mg by mouth daily.   metroNIDAZOLE 0.75 % cream Commonly known as:  METROCREAM Apply 1 application topically 2 (two) times daily as needed (for rosacea).   mirtazapine 15 MG tablet Commonly  known as:  REMERON Take 1 tablet (15 mg total) by mouth at bedtime.   montelukast 10 MG tablet Commonly known as:  SINGULAIR Take 10 mg by mouth at bedtime.   ONE-A-DAY WOMENS 50 PLUS PO Take 1 tablet by mouth daily.   oxyCODONE-acetaminophen 5-325 MG tablet Commonly known as:  ROXICET Take 1 tablet every 4 (four) hours as needed by mouth.   pantoprazole 40 MG tablet Commonly known as:  PROTONIX Take 40 mg by mouth daily.   PROBIOTIC DAILY PO Take 1 capsule by mouth daily.   ranitidine 150 MG tablet Commonly known as:  ZANTAC Take 150 mg by mouth twice daily   tiZANidine 2 MG tablet Commonly known as:  ZANAFLEX Take 1 tablet (2 mg total) every 6 (six) hours as needed by mouth for muscle spasms.   traZODone 50 MG tablet Commonly known as:   DESYREL Take 50 mg by mouth at bedtime.   triamcinolone cream 0.1 % Commonly known as:  KENALOG Apply 1 application topically 2 (two) times daily as needed (for eczema/psoriasis).   VENTOLIN HFA 108 (90 Base) MCG/ACT inhaler Generic drug:  albuterol Inhale 2 puffs into the lungs every 6 (six) hours as needed for wheezing or shortness of breath.            Durable Medical Equipment  (From admission, onward)        Start     Ordered   06/01/17 1044  DME Walker rolling  Once    Question:  Patient needs a walker to treat with the following condition  Answer:  Status post total left knee replacement   06/01/17 1043   06/01/17 1044  DME 3 n 1  Once     06/01/17 1043   06/01/17 1044  DME Bedside commode  Once    Question:  Patient needs a bedside commode to treat with the following condition  Answer:  Status post total left knee replacement   06/01/17 1043      Diagnostic Studies: Dg Chest 2 View  Result Date: 05/21/2017 CLINICAL DATA:  Left knee replacement. EXAM: CHEST  2 VIEW COMPARISON:  No recent prior . FINDINGS: Mediastinum and hilar structures normal. Lungs are clear. No pleural effusion or pneumothorax. Heart size normal. Thoracolumbar spine scoliosis. Biapical pleural-parenchymal thickening consistent with scarring. IMPRESSION: No active cardiopulmonary disease. Electronically Signed   By: [place]   On: 05/21/2017 13:28    Disposition: Final discharge disposition not confirmed  Discharge Instructions    Call MD / Call 911   Complete by:  As directed    If you experience chest pain or shortness of breath, CALL 911 and be transported to the hospital emergency room.  If you develope a fever above 101 F, pus (white drainage) or increased drainage or redness at the wound, or calf pain, call your surgeon's office.   Constipation Prevention   Complete by:  As directed    Drink plenty of fluids.  Prune juice may be helpful.  You may use a stool softener, such as  Colace (over the counter) 100 mg twice a day.  Use MiraLax (over the counter) for constipation as needed.   Diet - low sodium heart healthy   Complete by:  As directed    Driving restrictions   Complete by:  As directed    No driving for 2 weeks   Increase activity slowly as tolerated   Complete by:  As directed    Patient may shower  Complete by:  As directed    You may shower without a dressing once there is no drainage.  Do not wash over the wound.  If drainage remains, cover wound with plastic wrap and then shower.      Follow-up Information    Frederik Pear, MD Follow up in 2 week(s).   Specialty:  Orthopedic Surgery Contact information: [place] [phone]            Signed: Theodosia Quay 06/02/2017, 12:44 PM

## 2017-06-02 NOTE — Progress Notes (Signed)
PATIENT ID: Monica Page  MRN: [id number]  DOB/AGE:  67/30/1952 / 66 y.o.  1 Day Post-Op Procedure(s) (LRB): TOTAL KNEE ARTHROPLASTY (Left)    PROGRESS NOTE Subjective: Patient is alert, oriented, no Nausea, no Vomiting, yes passing gas. Taking PO well. Denies SOB, Chest or Calf Pain. Using Incentive Spirometer, PAS in place. Ambulate Has walked and room without difficulty, is able to do straight leg raise without difficulty. Patient reports pain as 2/10 .    Objective: Vital signs in last 24 hours: Vitals:   06/01/17 1029 06/01/17 1350 06/01/17 2023 06/02/17 0500  BP:  133/76 (!) 146/58 118/62  Pulse: 94 78 93 (!) 116  Resp: (!) 21 20 16 17   Temp: (!) 97.4 F (36.3 C) (!) 97 F (36.1 C) 98 F (36.7 C) 97.6 F (36.4 C)  TempSrc:  Oral Oral   SpO2: 96% 99% 98% 95%      Intake/Output from previous day: I/O last 3 completed shifts: In: 7425 [P.O.:490; I.V.:1250] Out: 750 [Urine:550; Blood:200]   Intake/Output this shift: No intake/output data recorded.   LABORATORY DATA: Recent Labs    06/02/17 0714  WBC 7.8  HGB 9.6*  HCT 30.7*  PLT 330    Examination: Neurologically intact ABD soft Neurovascular intact Sensation intact distally Intact pulses distally Dorsiflexion/Plantar flexion intact Incision: moderate drainage No cellulitis present Compartment soft} New Aquacel dressing was applied by me today.  There was no active bleeding at the wound is clean and there was no effusion. Assessment:   1 Day Post-Op Procedure(s) (LRB): TOTAL KNEE ARTHROPLASTY (Left) ADDITIONAL DIAGNOSIS: Expected Acute Blood Loss Anemia, Past history of depression  Plan: PT/OT WBAT, AROM and PROM  DVT Prophylaxis:  SCDx72hrs, ASA 325 mg BID x 2 weeks DISCHARGE PLAN: Home, If patient does well with the first PT session may be able to go home today. DISCHARGE NEEDS: HHPT, Walker and 3-in-1 comode seat     Leighann Amadon J 06/02/2017, 8:06 AM Patient ID: Monica Page, female   DOB:  Aug 09, 1950, 66 y.o.   MRN: [id number]

## 2017-06-02 NOTE — Care Management Note (Signed)
Case Management Note  Patient Details  Name: Monica Page MRN: [id number] Date of Birth: Dec 01, 1950  Subjective/Objective:                    Action/Plan:   Expected Discharge Date:  06/02/17               Expected Discharge Plan:     In-House Referral:     Discharge planning Services     Post Acute Care Choice:    Choice offered to:     DME Arranged:    DME Agency:     HH Arranged:    [facility] Agency:     Status of Service:     If discussed at [employer] of Stay Meetings, dates discussed:    Additional Comments:  Nila Nephew, RN 06/02/2017, 12:48 PM

## 2017-06-02 NOTE — Evaluation (Signed)
Occupational Therapy Evaluation Patient Details Name: Monica Page MRN: [id number] DOB: 02-05-1951 Today's Date: 06/02/2017    History of Present Illness Pt is a 66 y/o female s/p elective L TKA. PMH includes bladder cancer, OSA, and depression.    Clinical Impression   This 66 y/o F presents with the above. At baseline Pt is independent with ADLs and functional mobility. Pt completed functional mobility, toilet and shower transfers, standing grooming ADLs all at RW level with overall MinGuard assist this session. Pt requires ModA for LB ADLs secondary to LLE functional limitations. Pt reports will have 24 hr assist after return home. Feel Pt will safely progress home with available assist for ADLs PRN. Education provided and questions answered throughout with Pt verbalizing and return demonstrating understanding of education. No further acute OT needs identified at this time. Will sign off.     Follow Up Recommendations  DC plan and follow up therapy as arranged by surgeon;Supervision/Assistance - 24 hour    Equipment Recommendations  None recommended by OT           Precautions / Restrictions Precautions Precautions: Knee Precaution Comments: verbally reviewed with Pt  Restrictions Weight Bearing Restrictions: Yes LLE Weight Bearing: Weight bearing as tolerated      Mobility Bed Mobility               General bed mobility comments: OOB in recliner upon arrival   Transfers Overall transfer level: Needs assistance Equipment used: Rolling walker (2 wheeled) Transfers: Sit to/from Stand Sit to Stand: Supervision         General transfer comment: supervision for sit<>stand. Pt demonstrating safe hand placment and technique with walker.     Balance Overall balance assessment: Needs assistance Sitting-balance support: No upper extremity supported;Feet supported Sitting balance-Leahy Scale: Normal     Standing balance support: Single extremity  supported;Bilateral upper extremity supported;During functional activity Standing balance-Leahy Scale: Fair Standing balance comment: Pt able to maintain static standing while washing hands at sink without UE support and with close MinGuard for safety                            ADL either performed or assessed with clinical judgement   ADL Overall ADL's : Needs assistance/impaired Eating/Feeding: Independent;Sitting   Grooming: Min guard;Standing;Wash/dry hands   Upper Body Bathing: Sitting;Set up   Lower Body Bathing: Minimal assistance;Sit to/from stand   Upper Body Dressing : Sitting;Set up   Lower Body Dressing: Moderate assistance;Sit to/from stand   Toilet Transfer: Min guard;Ambulation;Comfort height toilet;Grab bars;RW   Toileting- Water quality scientist and Hygiene: Min guard;Sit to/from stand   Tub/ Shower Transfer: Walk-in shower;Min guard;Ambulation;3 in 1;Rolling walker Tub/Shower Transfer Details (indicate cue type and reason): educated on use of 3:1 as shower seat for increased stability/safety during task completion Functional mobility during ADLs: Min guard;Rolling walker General ADL Comments: Pt completing functional mobility at RW level with MinGuard; educated on safety and compensatory techniques for completing ADLs with Pt verbalzing and return demonstrating understanding                          Pertinent Vitals/Pain Pain Assessment: Faces Faces Pain Scale: Hurts even more Pain Location: L knee  Pain Descriptors / Indicators: Aching;Operative site guarding Pain Intervention(s): Limited activity within patient's tolerance;Monitored during session;Repositioned     Hand Dominance Right   Extremity/Trunk Assessment Upper Extremity Assessment Upper Extremity Assessment: Overall [facility]  for tasks assessed   Lower Extremity Assessment Lower Extremity Assessment: Defer to PT evaluation   Cervical / Trunk Assessment Cervical / Trunk  Assessment: Normal   Communication Communication Communication: No difficulties   Cognition Arousal/Alertness: Awake/alert Behavior During Therapy: WFL for tasks assessed/performed Overall Cognitive Status: Within Functional Limits for tasks assessed                                                      Home Living Family/patient expects to be discharged to:: Private residence Living Arrangements: Alone Available Help at Discharge: Friend(s);Available 24 hours/day Type of Home: House Home Access: Stairs to enter [employer] of Steps: 3 Entrance Stairs-Rails: Left Home Layout: Two level;Able to live on main level with bedroom/bathroom     Bathroom Shower/Tub: Occupational psychologist: Handicapped height     Home Equipment: Clinical cytogeneticist - 2 wheels;Bedside commode          Prior Functioning/Environment Level of Independence: Independent                 OT Problem List: Decreased strength;Decreased range of motion;Decreased knowledge of precautions;Decreased knowledge of use of DME or AE;Pain;Impaired balance (sitting and/or standing)            OT Goals(Current goals can be found in the care plan section) Acute Rehab OT Goals Patient Stated Goal: to return home today OT Goal Formulation: All assessment and education complete, DC therapy  OT Frequency: Min 2X/week                             AM-PAC PT "6 Clicks" Daily Activity     Outcome Measure Help from another person eating meals?: None Help from another person taking care of personal grooming?: A Little Help from another person toileting, which includes using toliet, bedpan, or urinal?: A Little Help from another person bathing (including washing, rinsing, drying)?: A Little Help from another person to put on and taking off regular upper body clothing?: None Help from another person to put on and taking off regular lower body clothing?: A Lot 6  Click Score: 19   End of Session Equipment Utilized During Treatment: Gait belt;Rolling walker Nurse Communication: Mobility status  Activity Tolerance: Patient tolerated treatment well Patient left: in chair;with call bell/phone within reach;with family/visitor present  OT Visit Diagnosis: Unsteadiness on feet (R26.81);Pain;Other abnormalities of gait and mobility (R26.89) Pain - Right/Left: Left Pain - part of body: Knee                Time: 1497-0263 OT Time Calculation (min): 20 min Charges:  OT General Charges $OT Visit: 1 Visit OT Evaluation $OT Eval Low Complexity: 1 Low G-Codes: OT G-codes **NOT FOR INPATIENT CLASS** Functional Assessment Tool Used: AM-PAC 6 Clicks Daily Activity;Clinical judgement Functional Limitation: Self care Self Care Current Status (Z8588): At least 20 percent but less than 40 percent impaired, limited or restricted Self Care Goal Status (F0277): At least 20 percent but less than 40 percent impaired, limited or restricted Self Care Discharge Status [id number]): At least 20 percent but less than 40 percent impaired, limited or restricted   Lou Cal, OT Pager [phone] 06/02/2017   Raymondo Band 06/02/2017, 3:08 PM

## 2017-06-02 NOTE — Care Management Note (Signed)
Case Management Note  Patient Details  Name: Monica Page MRN: [id number] Date of Birth: [birth year]  Subjective/Objective:   66 yr old female s/p left total knee arthroplasty.                 Action/Plan: Case manager spoke with patient concerning discharge plan and DME. Patient was preoperatively setup with [facility], no changes. She will have family support at discharge. DME has been delivered to patient's room. No further Cm needs identified.    Expected Discharge Date:  06/02/17               Expected Discharge Plan:  [place]  In-House Referral:     Discharge planning Services  CM Consult  Post Acute Care Choice:  Home Health Choice offered to:  Patient  DME Arranged:  N/A(has RW and 3in1) DME Agency:  NA  HH Arranged:  PT HH Agency:  [facility]  Status of Service:  Completed, signed off  If discussed at [place] of Stay Meetings, dates discussed:    Additional Comments:  Ninfa Meeker, RN 06/02/2017, 12:53 PM

## 2017-06-03 DIAGNOSIS — F329 Major depressive disorder, single episode, unspecified: Secondary | ICD-10-CM | POA: Diagnosis not present

## 2017-06-03 DIAGNOSIS — Z96652 Presence of left artificial knee joint: Secondary | ICD-10-CM | POA: Diagnosis not present

## 2017-06-03 DIAGNOSIS — Z471 Aftercare following joint replacement surgery: Secondary | ICD-10-CM | POA: Diagnosis not present

## 2017-06-03 DIAGNOSIS — J4520 Mild intermittent asthma, uncomplicated: Secondary | ICD-10-CM | POA: Diagnosis not present

## 2017-06-03 DIAGNOSIS — C679 Malignant neoplasm of bladder, unspecified: Secondary | ICD-10-CM | POA: Diagnosis not present

## 2017-06-03 DIAGNOSIS — G4730 Sleep apnea, unspecified: Secondary | ICD-10-CM | POA: Diagnosis not present

## 2017-06-03 NOTE — Telephone Encounter (Signed)
Medication refill- received fax from [place] store requesting a 90 day supply for Remeron. Last rx was sent to pharmacy on 05/14/17 for a 30 days supply. Pt is schedule for a follow up apt on 06/29/17. Please advise.

## 2017-06-03 NOTE — Telephone Encounter (Signed)
Please let patient know of concern of overdose and do not give 90 days supply if there is any history of overdose or attempt

## 2017-06-04 NOTE — Telephone Encounter (Signed)
Medication refill- received fax from [employer] requesting a refill for a 90 day supply for Remeron. Per Dr. De Nurse, refill is authorize for Remeron 15mg , #90. Rx was sent to pharmacy. Pt is schedule for a follow up apt on 06/29/17. Lvm informing pt of refill status. Nothing further is need at this time.

## 2017-06-05 DIAGNOSIS — G4730 Sleep apnea, unspecified: Secondary | ICD-10-CM | POA: Diagnosis not present

## 2017-06-05 DIAGNOSIS — J4520 Mild intermittent asthma, uncomplicated: Secondary | ICD-10-CM | POA: Diagnosis not present

## 2017-06-05 DIAGNOSIS — F329 Major depressive disorder, single episode, unspecified: Secondary | ICD-10-CM | POA: Diagnosis not present

## 2017-06-05 DIAGNOSIS — Z96652 Presence of left artificial knee joint: Secondary | ICD-10-CM | POA: Diagnosis not present

## 2017-06-05 DIAGNOSIS — C679 Malignant neoplasm of bladder, unspecified: Secondary | ICD-10-CM | POA: Diagnosis not present

## 2017-06-05 DIAGNOSIS — Z471 Aftercare following joint replacement surgery: Secondary | ICD-10-CM | POA: Diagnosis not present

## 2017-06-06 DIAGNOSIS — M25562 Pain in left knee: Secondary | ICD-10-CM | POA: Diagnosis not present

## 2017-06-08 DIAGNOSIS — G4730 Sleep apnea, unspecified: Secondary | ICD-10-CM | POA: Diagnosis not present

## 2017-06-08 DIAGNOSIS — Z471 Aftercare following joint replacement surgery: Secondary | ICD-10-CM | POA: Diagnosis not present

## 2017-06-08 DIAGNOSIS — Z96652 Presence of left artificial knee joint: Secondary | ICD-10-CM | POA: Diagnosis not present

## 2017-06-08 DIAGNOSIS — J4520 Mild intermittent asthma, uncomplicated: Secondary | ICD-10-CM | POA: Diagnosis not present

## 2017-06-08 DIAGNOSIS — C679 Malignant neoplasm of bladder, unspecified: Secondary | ICD-10-CM | POA: Diagnosis not present

## 2017-06-08 DIAGNOSIS — F329 Major depressive disorder, single episode, unspecified: Secondary | ICD-10-CM | POA: Diagnosis not present

## 2017-06-10 DIAGNOSIS — Z471 Aftercare following joint replacement surgery: Secondary | ICD-10-CM | POA: Diagnosis not present

## 2017-06-10 DIAGNOSIS — G4730 Sleep apnea, unspecified: Secondary | ICD-10-CM | POA: Diagnosis not present

## 2017-06-10 DIAGNOSIS — F329 Major depressive disorder, single episode, unspecified: Secondary | ICD-10-CM | POA: Diagnosis not present

## 2017-06-10 DIAGNOSIS — C679 Malignant neoplasm of bladder, unspecified: Secondary | ICD-10-CM | POA: Diagnosis not present

## 2017-06-10 DIAGNOSIS — J4520 Mild intermittent asthma, uncomplicated: Secondary | ICD-10-CM | POA: Diagnosis not present

## 2017-06-10 DIAGNOSIS — Z96652 Presence of left artificial knee joint: Secondary | ICD-10-CM | POA: Diagnosis not present

## 2017-06-11 DIAGNOSIS — F329 Major depressive disorder, single episode, unspecified: Secondary | ICD-10-CM | POA: Diagnosis not present

## 2017-06-11 DIAGNOSIS — Z96652 Presence of left artificial knee joint: Secondary | ICD-10-CM | POA: Diagnosis not present

## 2017-06-11 DIAGNOSIS — C679 Malignant neoplasm of bladder, unspecified: Secondary | ICD-10-CM | POA: Diagnosis not present

## 2017-06-11 DIAGNOSIS — J4520 Mild intermittent asthma, uncomplicated: Secondary | ICD-10-CM | POA: Diagnosis not present

## 2017-06-11 DIAGNOSIS — Z471 Aftercare following joint replacement surgery: Secondary | ICD-10-CM | POA: Diagnosis not present

## 2017-06-11 DIAGNOSIS — G4730 Sleep apnea, unspecified: Secondary | ICD-10-CM | POA: Diagnosis not present

## 2017-06-11 DIAGNOSIS — M25562 Pain in left knee: Secondary | ICD-10-CM | POA: Diagnosis not present

## 2017-06-12 ENCOUNTER — Other Ambulatory Visit: Payer: Self-pay

## 2017-06-12 ENCOUNTER — Ambulatory Visit (INDEPENDENT_AMBULATORY_CARE_PROVIDER_SITE_OTHER): Payer: Medicare Other | Admitting: Rehabilitative and Restorative Service Providers"

## 2017-06-12 ENCOUNTER — Encounter: Payer: Self-pay | Admitting: Physician Assistant

## 2017-06-12 ENCOUNTER — Ambulatory Visit (INDEPENDENT_AMBULATORY_CARE_PROVIDER_SITE_OTHER): Payer: Medicare Other | Admitting: Physician Assistant

## 2017-06-12 ENCOUNTER — Encounter: Payer: Self-pay | Admitting: Rehabilitative and Restorative Service Providers"

## 2017-06-12 VITALS — BP 110/61 | HR 102 | Ht 62.99 in | Wt 194.0 lb

## 2017-06-12 DIAGNOSIS — R2689 Other abnormalities of gait and mobility: Secondary | ICD-10-CM | POA: Diagnosis not present

## 2017-06-12 DIAGNOSIS — Z13220 Encounter for screening for lipoid disorders: Secondary | ICD-10-CM | POA: Diagnosis not present

## 2017-06-12 DIAGNOSIS — M25562 Pain in left knee: Secondary | ICD-10-CM

## 2017-06-12 DIAGNOSIS — Z96653 Presence of artificial knee joint, bilateral: Secondary | ICD-10-CM | POA: Diagnosis not present

## 2017-06-12 DIAGNOSIS — M6281 Muscle weakness (generalized): Secondary | ICD-10-CM | POA: Diagnosis not present

## 2017-06-12 DIAGNOSIS — J302 Other seasonal allergic rhinitis: Secondary | ICD-10-CM

## 2017-06-12 DIAGNOSIS — K219 Gastro-esophageal reflux disease without esophagitis: Secondary | ICD-10-CM | POA: Diagnosis not present

## 2017-06-12 DIAGNOSIS — R29898 Other symptoms and signs involving the musculoskeletal system: Secondary | ICD-10-CM

## 2017-06-12 DIAGNOSIS — J4520 Mild intermittent asthma, uncomplicated: Secondary | ICD-10-CM | POA: Diagnosis not present

## 2017-06-12 DIAGNOSIS — D509 Iron deficiency anemia, unspecified: Secondary | ICD-10-CM | POA: Diagnosis not present

## 2017-06-12 DIAGNOSIS — R7989 Other specified abnormal findings of blood chemistry: Secondary | ICD-10-CM | POA: Diagnosis not present

## 2017-06-12 MED ORDER — PANTOPRAZOLE SODIUM 40 MG PO TBEC: 40.0000 mg | DELAYED_RELEASE_TABLET | Freq: Every day | ORAL | 3 refills | Status: DC

## 2017-06-12 MED ORDER — RANITIDINE HCL 150 MG PO TABS: ORAL_TABLET | ORAL | 3 refills | Status: DC

## 2017-06-12 MED ORDER — MONTELUKAST SODIUM 10 MG PO TABS: 10.0000 mg | ORAL_TABLET | Freq: Every day | ORAL | 3 refills | Status: DC

## 2017-06-12 NOTE — Patient Instructions (Addendum)
Self-Mobilization: Posterior Glide   Let gravity help you bend knee  With towel roll wedged behind left knee, apply a gentle force down and back through knee. Hold __10-20__ seconds. Repeat _5___ times per set. Do _1-2___ sets per session. Do __3__ sessions per day.    Self-Mobilization: Knee Flexion (Hook-Lying)    Bend left knee as far as possible, then use other leg to gently push until stretch is felt. Hold __10-20__ seconds. Relax. Repeat _5___ times per set. Do __1-2_ sets per session. Do _3___ sessions per day.   Sitting with foot on floor slide foot back to bend knee  Hold 10-20 sec  5 reps 1-2 sets 3 times/day

## 2017-06-12 NOTE — Progress Notes (Signed)
Subjective:    Patient ID: Monica Page, female    DOB: Dec 16, 1950, 66 y.o.   MRN: [id number]  HPI  Patient is a 66 year old female who presents to the clinic 11 days after left total knee replacement.  She has a past medical history of depression, anxiety, iron deficiency anemia.  She comes in today for her 90-month follow-up.  She is doing fairly well after her left total knee replacement on 11/5.  She has her next follow-up appointment with surgeon on 11/27.  She was started on Keflex yesterday for some concern of infection around the incision.  She will follow this closely.  GERD-patient is well controlled on Protonix and ranitidine.  She has no problems or concerns.  Her seasonal allergies and asthma are well controlled on Singulair.  She needs refills today.  She sees a psychiatrist for her depression management.  .. Active Ambulatory Problems    Diagnosis Date Noted  . Bladder cancer ([place]) 03/20/2017  . Hematuria 03/20/2017  . 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 Iron deficiency anemia 03/20/2017  . Post-menopausal 03/20/2017  . Elevated TSH 03/20/2017  . No energy 03/20/2017  . 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 Mild intermittent asthma without complication 64/40/3474  . Eczema 03/20/2017  . Gastroesophageal reflux disease 03/20/2017  . Normocytic anemia 03/22/2017  . Pain of left thumb 03/23/2017  . Degenerative arthritis of left knee 05/29/2017  . Primary osteoarthritis of left knee 06/01/2017   Resolved Ambulatory Problems    Diagnosis Date Noted  . No Resolved Ambulatory Problems   Past Medical History:  Diagnosis Date  . Anemia   . Anxiety   . Arthritis   . Asthma   . Cancer ([place]) 07/28/2004  . Complication of anesthesia   . Depression   . GERD (gastroesophageal reflux disease)   . Sleep apnea       Review of Systems  All other systems reviewed and are negative.      Objective:   Physical Exam  Constitutional: She is oriented to person, place, and time. She appears well-developed and well-nourished.  HENT:  Head: Normocephalic and atraumatic.  Cardiovascular: Normal rate, regular rhythm and normal heart sounds.  Pulmonary/Chest: Effort normal and breath sounds normal. She has no wheezes.  Neurological: She is alert and oriented to person, place, and time.  Psychiatric: She has a normal mood and affect. Her behavior is normal.          Assessment & Plan:  Marland KitchenMarland KitchenDaphyne was seen today for follow-up.  Diagnoses and all orders for this visit:  Status post total bilateral knee replacement -     COMPLETE METABOLIC PANEL WITH GFR  Gastroesophageal reflux disease, esophagitis presence not specified -     ranitidine (ZANTAC) 150 MG tablet; Take 150 mg by mouth twice daily -     pantoprazole (PROTONIX) 40 MG tablet; Take 1 tablet (40 mg total) daily by mouth. -     COMPLETE METABOLIC PANEL WITH GFR  Mild intermittent asthma without complication -     montelukast (SINGULAIR) 10 MG tablet; Take 1 tablet (10 mg total) at bedtime by mouth. -     COMPLETE METABOLIC PANEL WITH GFR  Seasonal allergies -     montelukast (SINGULAIR) 10 MG tablet; Take 1 tablet (10 mg total) at bedtime by mouth. -     COMPLETE METABOLIC PANEL WITH GFR  Elevated TSH  Iron deficiency anemia, unspecified iron deficiency anemia type -  CBC  Screening for lipid disorders -     Lipid Panel w/reflex Direct LDL   Refills given today.   Pt is walking with walker but doing great after knee replacement. On keflex.discussed warning signs of worsening infection with fever, chills, more swelling, pain. Follow up with surgeon.   Fasting labs ordered.   Depression managed by Dr. De Nurse.   Follow up in 6 months. Pt declines preventative care at this time. "she wants to heal from knee replacement". Will address at CPE in 6 months.

## 2017-06-12 NOTE — Therapy (Signed)
[facility] [place], [place], [address] Phone: [phone]   Fax:  [phone]  Physical Therapy Evaluation  Patient Details  Name: Monica Page MRN: [id number] Date of Birth: [birth year] Referring Provider: Dr Frederik Pear    Encounter Date: 06/12/2017  PT End of Session - 06/12/17 1229    Visit Number  1    Number of Visits  24    Date for PT Re-Evaluation  09/04/17    PT Start Time  2725    PT Stop Time  1243    PT Time Calculation (min)  58 min    Activity Tolerance  Patient tolerated treatment well    Behavior During Therapy  [facility] for tasks assessed/performed       Past Medical History:  Diagnosis Date  . Anemia    hx. of anemia  . Anxiety   . Arthritis   . Asthma   . 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 Complication of anesthesia    asthma attack coming out of anesthesia  . Depression   . GERD (gastroesophageal reflux disease)   . Sleep apnea    CPAP does not use    Past Surgical History:  Procedure Laterality Date  . broken elbow Right   . brooken toe Right   . EYE SURGERY    . infected knee relpacement Right 2017  . JOINT REPLACEMENT Right 07/29/2015   knee  . 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 TOTAL KNEE ARTHROPLASTY Left 06/01/2017   Performed by Frederik Pear, MD at [place]  . TUBAL LIGATION      There were no vitals filed for this visit.   Subjective Assessment - 06/12/17 1150    Subjective  Patient reports that she has had Lt knee pain for years due to OA - treated with injections without improvement. She underwent Lt TKA 06/01/17. She has had some swelling and increased temp - seen by MD yesterday and started on antibiotic. She has had a lot of pain post operatively.     Pertinent History  Rt TKA 2014 with infection following surgery requiring second surgery and IV antibiotics 7 wk/oral antibiotics 6 months; OA multiple joints; depression; acid reflux     How long  can you sit comfortably?  15 min     How long can you stand comfortably?  20 min     How long can you walk comfortably?  with walker 5 min     Diagnostic tests  xrays     Patient Stated Goals  get rid og knee pain and tightness; get knee moving again     Currently in Pain?  Yes    Pain Score  8     Pain Location  Knee    Pain Orientation  Left    Pain Descriptors / Indicators  Aching;Burning;Discomfort;Sharp;Tightness;Throbbing    Pain Type  Surgical pain    Pain Radiating Towards  knee into calf - muscles tight in thigh     Pain Onset  More than a month ago    Pain Frequency  Intermittent    Aggravating Factors   movement     Pain Relieving Factors  rest; ice; meds          [facility] PT Assessment - 06/12/17 0001      Assessment   Medical Diagnosis  Lt TKA     Referring Provider  Dr Frederik Pear     Onset Date/Surgical Date  06/01/17    Hand Dominance  Right    Next MD Visit  06/23/17    Prior Therapy  post op hospital; HHPT x 5 visits       Precautions   Precautions  None      Restrictions   Weight Bearing Restrictions  No      Balance Screen   Has the patient fallen in the past 6 months  Yes    How many times?  2 tripped over vacuum in home/over dog toy at night     Has the patient had a decrease in activity level because of a fear of falling?   No    Is the patient reluctant to leave their home because of a fear of falling?   No      Home Environment   Living Environment  Private residence    [place]  Two level bedroom on second level     Alternate Level Stairs-Rails  Can reach both 3 steps into home     [place] - 2 wheels;Shower seat;Tub bench;Toilet riser;Grab bars - tub/shower;Hand held shower head;Cane - single point      Prior Function   Level of Independence  Independent    Vocation  Retired    [employer]  worked at [employer] job 25 yrs - retired 2012     Leisure  household chores; gardening; walking dog       Observation/Other Assessments   Focus on Therapeutic Outcomes (FOTO)   74% limitation       Observation/Other Assessments-Edema    Edema  -- edema Lt knee       Sensation   Additional Comments  WFL's per pt report       Posture/Postural Control   Posture Comments  head forward; shoudlers rounded and elevated; flexed forward at hips; knees flexed       AROM   Right/Left Hip  -- tight hip flexors bilat otherwise WFL's     Right Knee Extension  0    Right Knee Flexion  113    Left Knee Extension  -8    Left Knee Flexion  80 98 deg seated post ROM exercises     Right/Left Ankle  -- WFL's       Strength   Overall Strength Comments  Strength assessed in supine due to pain     Right/Left Hip  -- 5/5 bilat except Lt hip abduction 4+/5 ; ext 5-/5     Right/Left Knee  -- Rt knee flex/ext 5/5     Right/Left Ankle  -- 5/5 bilat ankle DF       Flexibility   Hamstrings  ~ 80-85 deg bilat     ITB  WNL's bilat       Ambulation/Gait   Gait Comments  ambulates wiht rolling walker - flexed forward posture; abnormal gait pattern with decreased wt bearing Lt LE step to gait pattern              Objective measurements completed on examination: See above findings.      [facility] Adult PT Treatment/Exercise - 06/12/17 0001      Knee/Hip Exercises: Stretches   Other Knee/Hip Stretches  knee flexion supine allowing gravity to assit bend 10 sec x 5       Knee/Hip Exercises: Seated   Heel Slides  AAROM;Left;5 reps 10 sec hold       Knee/Hip Exercises: Supine  Heel Slides  AAROM;Left;5 reps 10 sec hold       Vasopneumatic   Number Minutes Vasopneumatic   15 minutes    Vasopnuematic Location   Knee Lt    Vasopneumatic Pressure  Low    Vasopneumatic Temperature   34 deg              PT Education - 06/12/17 1215    Education provided  Yes    Education Details  HEP ice     Person(s) Educated  Patient    Methods  Explanation;Demonstration;Tactile cues;Verbal cues;Handout     Comprehension  Verbalized understanding;Returned demonstration;Verbal cues required       PT Short Term Goals - 06/12/17 1234      PT SHORT TERM GOAL #1   Title  Increase AROM Lt knee to -2 deg ext to 108 deg flexion 07/24/17    Time  6    Period  Weeks    Status  New      PT SHORT TERM GOAL #2   Title  5/5 strength Lt LE 07/24/17    Time  6    Period  Weeks    Status  New      PT SHORT TERM GOAL #3   Title  Improve gait pattern with rolling walker to increase safety and independence 07/24/17    Time  6    Period  Weeks    Status  New        PT Long Term Goals - 06/12/17 1236      PT LONG TERM GOAL #1   Title  Decrease pain in Lt knee allowing patient to preform normal functioinal activities 09/04/17    Time  12    Period  Weeks    Status  New      PT LONG TERM GOAL #2   Title  Increase AROM Lt knee to 0 deg extension to 115 deg flexion 09/04/17    Time  12    Period  Weeks    Status  New      PT LONG TERM GOAL #3   Title  Independent gait without assistive device and good gait pattern 09/04/17    Time  12    Period  Weeks    Status  New      PT LONG TERM GOAL #4   Title  Independent in HEP 09/04/17    Time  12    Period  Weeks    Status  New      PT LONG TERM GOAL #5   Title  Improve FOTO to </= 51% limitation 09/04/17    Time  12    Period  Weeks    Status  New             Plan - 06/12/17 1230    Clinical Impression Statement  Terri presents s/p Lt knee TKA 06/01/17. She has some increase in temp and edema Lt knee this week and was seen by MD yesterday and started on oral antibiotics. Patient presents today with pain, limited ROM and strength Lt LE; edema; abnormal gait pattern; limited functional activity level and ADL's following surgery. Patient will benefit from PT to address problems identified.     History and Personal Factors relevant to plan of care:  Rt knee surgery with TKA x 2 and I&D for infection     Clinical Presentation  Evolving     Clinical Decision Making  Low    Rehab  Potential  Good    PT Frequency  2x / week    PT Duration  12 weeks    PT Treatment/Interventions  Patient/family education;ADLs/Self Care Home Management;Cryotherapy;Electrical Stimulation;Iontophoresis 4mg /ml Dexamethasone;Moist Heat;Ultrasound;Dry needling;Manual techniques;Neuromuscular re-education;Functional mobility training;Therapeutic activities;Therapeutic exercise;Gait training;Stair training    PT Next Visit Plan  ROM and strengthening exercises; gait training; modalities as indicated     Consulted and Agree with Plan of Care  Patient       Patient will benefit from skilled therapeutic intervention in order to improve the following deficits and impairments:  Postural dysfunction, Improper body mechanics, Increased fascial restricitons, Increased muscle spasms, Decreased mobility, Decreased range of motion, Abnormal gait, Decreased activity tolerance  Visit Diagnosis: Left knee pain, unspecified chronicity - Plan: PT plan of care cert/re-cert  Muscle weakness (generalized) - Plan: PT plan of care cert/re-cert  Other symptoms and signs involving the musculoskeletal system - Plan: PT plan of care cert/re-cert  Other abnormalities of gait and mobility - Plan: PT plan of care cert/re-cert  [facility] PT PB G-CODES - 06-26-2017 1240    Functional Assessment Tool Used   evaluation; FOTO; clinical assessment     Functional Limitations  Mobility: Walking and moving around    Mobility: Walking and Moving Around Current Status  At least 60 percent but less than 80 percent impaired, limited or restricted    Mobility: Walking and Moving Around Goal Status [phone])  At least 40 percent but less than 60 percent impaired, limited or restricted        Problem List Patient Active Problem List   Diagnosis Date Noted  . 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 Degenerative arthritis of left knee 05/29/2017  . Pain of left thumb 03/23/2017  .  Normocytic anemia 03/22/2017  . Bladder cancer ([place]) 03/20/2017  . Hematuria 03/20/2017  . 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 Iron deficiency anemia 03/20/2017  . Post-menopausal 03/20/2017  . Elevated TSH 03/20/2017  . No energy 03/20/2017  . Moderate major depression ([place]) 03/20/2017  . Mild intermittent asthma without complication 34/91/7915  . Eczema 03/20/2017  . Gastroesophageal reflux disease 03/20/2017    Nawaf Strange Nilda Simmer PT, MPH 06-26-17, 1:28 PM  [facility] [place], [place], [address] Phone: [phone]   Fax:  [phone]  Name: LOANN CHAHAL MRN: [id number] Date of Birth: [birth year]

## 2017-06-15 ENCOUNTER — Ambulatory Visit (INDEPENDENT_AMBULATORY_CARE_PROVIDER_SITE_OTHER): Payer: Medicare Other | Admitting: Physical Therapy

## 2017-06-15 DIAGNOSIS — R29898 Other symptoms and signs involving the musculoskeletal system: Secondary | ICD-10-CM

## 2017-06-15 DIAGNOSIS — R2689 Other abnormalities of gait and mobility: Secondary | ICD-10-CM

## 2017-06-15 DIAGNOSIS — Z96653 Presence of artificial knee joint, bilateral: Secondary | ICD-10-CM | POA: Diagnosis not present

## 2017-06-15 DIAGNOSIS — M6281 Muscle weakness (generalized): Secondary | ICD-10-CM | POA: Diagnosis not present

## 2017-06-15 DIAGNOSIS — K219 Gastro-esophageal reflux disease without esophagitis: Secondary | ICD-10-CM | POA: Diagnosis not present

## 2017-06-15 DIAGNOSIS — J302 Other seasonal allergic rhinitis: Secondary | ICD-10-CM | POA: Diagnosis not present

## 2017-06-15 DIAGNOSIS — J4520 Mild intermittent asthma, uncomplicated: Secondary | ICD-10-CM | POA: Diagnosis not present

## 2017-06-15 DIAGNOSIS — D509 Iron deficiency anemia, unspecified: Secondary | ICD-10-CM | POA: Diagnosis not present

## 2017-06-15 DIAGNOSIS — M25562 Pain in left knee: Secondary | ICD-10-CM | POA: Diagnosis present

## 2017-06-15 DIAGNOSIS — Z13220 Encounter for screening for lipoid disorders: Secondary | ICD-10-CM | POA: Diagnosis not present

## 2017-06-15 DIAGNOSIS — R7309 Other abnormal glucose: Secondary | ICD-10-CM | POA: Diagnosis not present

## 2017-06-15 DIAGNOSIS — E781 Pure hyperglyceridemia: Secondary | ICD-10-CM | POA: Diagnosis not present

## 2017-06-15 NOTE — Patient Instructions (Addendum)
KNEE: Knee Hang - Prone    Lie on stomach. Place towel above knee; hang feet off surface. Keep feet straight. Hold __30-120_ seconds. _2-3__ reps per set,  This is to help straighten knee.  Step-Up: Forward    Leading with left leg, bring both feet onto _6___ inch step. Return to starting position, leading with right leg. Repeat _10-20___ times per session. Do _1-2___ sessions per day.  Calf Stretch    Place hands on wall at shoulder height. Keeping back leg straight, bend front leg, feet pointing forward, heels flat on floor. Lean forward slightly until stretch is felt in calf of back leg. Hold stretch _30__ seconds, breathing slowly in and out. Repeat stretch with other leg back.  2-3 repetitions.  Do _2__ sessions per day. Variation: Use chair or table for support.

## 2017-06-15 NOTE — Therapy (Signed)
[place], [place], [address] Phone: [phone]   Fax:  [phone]  Physical Therapy Treatment  Patient Details  Name: Monica Page MRN: [id number] Date of Birth: November 07, 1950 Referring Provider: Dr. Ulyses Southward   Encounter Date: 06/15/2017  PT End of Session - 06/15/17 1440    Visit Number  2    Number of Visits  24    Date for PT Re-Evaluation  09/04/17    PT Start Time  3818    PT Stop Time  1520    PT Time Calculation (min)  43 min    Activity Tolerance  Patient tolerated treatment well    Behavior During Therapy  [facility] for tasks assessed/performed       Past Medical History:  Diagnosis Date  . Anemia    hx. of anemia  . Anxiety   . Arthritis   . Asthma   . 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 Complication of anesthesia    asthma attack coming out of anesthesia  . Depression   . GERD (gastroesophageal reflux disease)   . Sleep apnea    CPAP does not use    Past Surgical History:  Procedure Laterality Date  . broken elbow Right   . brooken toe Right   . EYE SURGERY    . infected knee relpacement Right 2017  . JOINT REPLACEMENT Right 07/29/2015   knee  . 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 TOTAL KNEE ARTHROPLASTY Left 06/01/2017   Performed by Frederik Pear, MD at [place]  . TUBAL LIGATION      There were no vitals filed for this visit.  Subjective Assessment - 06/15/17 1441    Subjective  Pt reports she did not do her exercises over the weekend, just iced and elevated.  She is still on antibiotics; finishes on the 25th. She uses RW in community, but ambulates in the home without any AD.  She feels the incision looks much better than last week.     Pertinent History  Rt TKA 2014 with infection following surgery requiring second surgery and IV antibiotics 7 wk/oral antibiotics 6 months; OA multiple joints; depression; acid reflux     Currently in Pain?  Yes    Pain  Score  5     Pain Location  Knee    Pain Orientation  Left    Aggravating Factors   movement    Pain Relieving Factors  rest, ice, meds          [facility] PT Assessment - 06/15/17 0001      Assessment   Medical Diagnosis  Lt TKA     Referring Provider  Dr. Ulyses Southward    Onset Date/Surgical Date  06/01/17    Hand Dominance  Right    Next MD Visit  06/23/17    Prior Therapy  post op hospital; HHPT x 5 visits       AROM   Left Knee Extension  -8 supine with quad set    Left Knee Flexion  102 with seated scoot        [facility] Adult PT Treatment/Exercise - 06/15/17 0001      Self-Care   Self-Care  Other Self-Care Comments    Other Self-Care Comments   Pt educated on scar massage and light retrograde massage over Lt knee to decrease swelling; pt verbalized understanding and returned demo.       Knee/Hip Exercises: Stretches  Gastroc Stretch  Right;Left;2 reps;30 seconds VC for form      Knee/Hip Exercises: Aerobic   Nustep  L4: slow speed, used to warm up and increase ROM for Lt kne x 5.5 min       Knee/Hip Exercises: Standing   Heel Raises  Both;1 set;10 reps and toe raises    Lateral Step Up  Left;1 set;10 reps;Hand Hold: 2;Step Height: 6"    Forward Step Up  Left;1 set;10 reps;Hand Hold: 2;Step Height: 6"    Step Down  Right;1 set;10 reps;Hand Hold: 2 3" step    Stairs  attempted 3-6" steps with reciprocal pattern, difficult- unable to descend 6" forward.     SLS  Rt/Lt SLS - up to 4 sec.        Knee/Hip Exercises: Seated   Long Arc Quad  AROM;Left;1 set;10 reps    Other Seated Knee/Hip Exercises  seated scoots x 10 sec       Knee/Hip Exercises: Prone   Hamstring Curl  5 reps;3 sets    Prone Knee Hang  1 minute 3 reps; patella off of table.       Vasopneumatic   Number Minutes Vasopneumatic   8 minutes pt complained of increased pain; req to stop.     Vasopnuematic Location   Knee Lt    Vasopneumatic Pressure  Low    Vasopneumatic Temperature   34 deg               PT Education - 06/15/17 1516    Education provided  Yes    Education Details  HEP    Person(s) Educated  Patient    Methods  Explanation;Handout;Verbal cues;Demonstration    Comprehension  Verbalized understanding       PT Short Term Goals - 06/12/17 1234      PT SHORT TERM GOAL #1   Title  Increase AROM Lt knee to -2 deg ext to 108 deg flexion 07/24/17    Time  6    Period  Weeks    Status  New      PT SHORT TERM GOAL #2   Title  5/5 strength Lt LE 07/24/17    Time  6    Period  Weeks    Status  New      PT SHORT TERM GOAL #3   Title  Improve gait pattern with rolling walker to increase safety and independence 07/24/17    Time  6    Period  Weeks    Status  New        PT Long Term Goals - 06/12/17 1236      PT LONG TERM GOAL #1   Title  Decrease pain in Lt knee allowing patient to preform normal functioinal activities 09/04/17    Time  12    Period  Weeks    Status  New      PT LONG TERM GOAL #2   Title  Increase AROM Lt knee to 0 deg extension to 115 deg flexion 09/04/17    Time  12    Period  Weeks    Status  New      PT LONG TERM GOAL #3   Title  Independent gait without assistive device and good gait pattern 09/04/17    Time  12    Period  Weeks    Status  New      PT LONG TERM GOAL #4   Title  Independent in HEP 09/04/17  Time  12    Period  Weeks    Status  New      PT LONG TERM GOAL #5   Title  Improve FOTO to </= 51% limitation 09/04/17    Time  12    Period  Weeks    Status  New            Plan - 06/15/17 1447    Clinical Impression Statement  Pt ambulates into therapy with RW; she was able to ambulate without AD in therapy session without difficulty.  She demonstrated improved Lt knee flexion ROM; extension ROM unchanged.  She was unable to tolerate vaso; stopped after 8 min due to increase pain.  Pt progressing towards goals.     Rehab Potential  Good    PT Frequency  2x / week    PT Duration  12 weeks    PT  Treatment/Interventions  Patient/family education;ADLs/Self Care Home Management;Cryotherapy;Electrical Stimulation;Iontophoresis 4mg /ml Dexamethasone;Moist Heat;Ultrasound;Dry needling;Manual techniques;Neuromuscular re-education;Functional mobility training;Therapeutic activities;Therapeutic exercise;Gait training;Stair training    PT Next Visit Plan  ROM and strengthening exercises for Lt knee; gait training; modalities as indicated. Trial of Rock tape to decrease pain/swelling.    Consulted and Agree with Plan of Care  Patient       Patient will benefit from skilled therapeutic intervention in order to improve the following deficits and impairments:  Postural dysfunction, Improper body mechanics, Increased fascial restricitons, Increased muscle spasms, Decreased mobility, Decreased range of motion, Abnormal gait, Decreased activity tolerance  Visit Diagnosis: Left knee pain, unspecified chronicity  Muscle weakness (generalized)  Other symptoms and signs involving the musculoskeletal system  Other abnormalities of gait and mobility     Problem List Patient Active Problem List   Diagnosis Date Noted  . 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 Degenerative arthritis of left knee 05/29/2017  . Pain of left thumb 03/23/2017  . Normocytic anemia 03/22/2017  . Bladder cancer ([place]) 03/20/2017  . Hematuria 03/20/2017  . 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 Iron deficiency anemia 03/20/2017  . Post-menopausal 03/20/2017  . Elevated TSH 03/20/2017  . No energy 03/20/2017  . Moderate major depression ([place]) 03/20/2017  . Mild intermittent asthma without complication 24/40/1027  . Eczema 03/20/2017  . Gastroesophageal reflux disease 03/20/2017   Kerin Perna, PTA 06/15/17 4:28 PM  [place], [place], [address] Phone: [phone]   Fax:   [phone]  Name: Monica Page MRN: [id number] Date of Birth: [birth year]

## 2017-06-16 ENCOUNTER — Other Ambulatory Visit (HOSPITAL_COMMUNITY): Payer: Self-pay | Admitting: Psychiatry

## 2017-06-16 ENCOUNTER — Encounter: Payer: Self-pay | Admitting: Physician Assistant

## 2017-06-16 DIAGNOSIS — R748 Abnormal levels of other serum enzymes: Secondary | ICD-10-CM | POA: Insufficient documentation

## 2017-06-16 DIAGNOSIS — R7301 Impaired fasting glucose: Secondary | ICD-10-CM | POA: Insufficient documentation

## 2017-06-16 DIAGNOSIS — E781 Pure hyperglyceridemia: Secondary | ICD-10-CM | POA: Insufficient documentation

## 2017-06-16 DIAGNOSIS — E8809 Other disorders of plasma-protein metabolism, not elsewhere classified: Secondary | ICD-10-CM | POA: Insufficient documentation

## 2017-06-17 ENCOUNTER — Ambulatory Visit (INDEPENDENT_AMBULATORY_CARE_PROVIDER_SITE_OTHER): Payer: Medicare Other | Admitting: Physical Therapy

## 2017-06-17 DIAGNOSIS — R29898 Other symptoms and signs involving the musculoskeletal system: Secondary | ICD-10-CM | POA: Diagnosis not present

## 2017-06-17 DIAGNOSIS — R2689 Other abnormalities of gait and mobility: Secondary | ICD-10-CM

## 2017-06-17 DIAGNOSIS — M6281 Muscle weakness (generalized): Secondary | ICD-10-CM | POA: Diagnosis not present

## 2017-06-17 DIAGNOSIS — M25562 Pain in left knee: Secondary | ICD-10-CM

## 2017-06-17 NOTE — Patient Instructions (Signed)

## 2017-06-17 NOTE — Therapy (Signed)
[employer], [place], [address] Phone: [phone]   Fax:  [phone]  Physical Therapy Treatment  Patient Details  Name: Monica Page MRN: [id number] Date of Birth: 10-13-1950 Referring Provider: Dr. Mayer Camel   Encounter Date: 06/17/2017  PT End of Session - 06/17/17 1116    Visit Number  3    Number of Visits  24    Date for PT Re-Evaluation  09/04/17    PT Start Time  1100    PT Stop Time  1148    PT Time Calculation (min)  48 min       Past Medical History:  Diagnosis Date  . Anemia    hx. of anemia  . Anxiety   . Arthritis   . Asthma   . 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 Complication of anesthesia    asthma attack coming out of anesthesia  . Depression   . GERD (gastroesophageal reflux disease)   . Sleep apnea    CPAP does not use    Past Surgical History:  Procedure Laterality Date  . broken elbow Right   . brooken toe Right   . EYE SURGERY    . infected knee relpacement Right 2017  . JOINT REPLACEMENT Right 07/29/2015   knee  . 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 TOTAL KNEE ARTHROPLASTY Left 06/01/2017   Procedure: TOTAL KNEE ARTHROPLASTY;  Surgeon: Frederik Pear, MD;  Location: [facility];  Service: Orthopedics;  Laterality: Left;  . TUBAL LIGATION      There were no vitals filed for this visit.  Subjective Assessment - 06/17/17 1116    Subjective  Pt ambulates into therapy without AD.  She had a fever last night, woke up sweating in AM.  She took no pain medicine this AM.     Currently in Pain?  Yes    Pain Score  3     Pain Location  Knee    Pain Orientation  Left    Pain Descriptors / Indicators  Aching    Aggravating Factors   movement    Pain Relieving Factors  rest, ice.          [facility] PT Assessment - 06/17/17 0001      Assessment   Medical Diagnosis  Lt TKA     Referring Provider  Dr. Mayer Camel    Onset Date/Surgical Date  06/01/17    Hand  Dominance  Right    Next MD Visit  06/23/17    Prior Therapy  post op hospital; HHPT x 5 visits       AROM   Left Knee Flexion  104 on bicycle, partial revolutions      Flexibility   Soft Tissue Assessment /Muscle Length  yes    Quadriceps  Lt knee 58 deg, prone quad stretch.       Ambulation/Gait   Gait Pattern  Decreased dorsiflexion - left;Decreased weight shift to left;Step-through pattern;Decreased stance time - left;Trunk flexed      [facility] Adult PT Treatment/Exercise - 06/17/17 0001      Knee/Hip Exercises: Stretches   Quad Stretch  Right;Left;2 reps;30 seconds    Gastroc Stretch  Right;Left;2 reps;30 seconds VC for form      Knee/Hip Exercises: Aerobic   Recumbent Bike  partial revolutions for ROM x 5 min     Nustep  L4: slow speed, used to warm up and increase ROM for Lt kne x 5.5 min  Other Aerobic  laps around gym in between exercises to decrease stiffness.       Knee/Hip Exercises: Standing   Lateral Step Up  Left;1 set;10 reps;Hand Hold: 2;Step Height: 6"    Forward Step Up  Left;1 set;10 reps;Hand Hold: 2;Step Height: 6" VC for posture    Step Down  Right;1 set;Hand Hold: 2;15 reps 3" step    Gait Training  25 ft lengths, without AD;  VC for upright posture and for increased toe off, pressing through the foot; improved with repeititon and VC.       Knee/Hip Exercises: Seated   Other Seated Knee/Hip Exercises  seated scoots x 10 sec x 3 reps       Knee/Hip Exercises: Supine   Quad Sets  Left;1 set;10 reps      Knee/Hip Exercises: Prone   Hamstring Curl  5 reps;3 sets    Prone Knee Hang  1 minute 3 reps; patella off of table.       Modalities   Modalities  -- pt declined; will use ice at home.       Manual Therapy   Manual Therapy  Passive ROM;Taping    Passive ROM  overpressure into Lt knee  ext x 15 sec x 4 reps (pt had limited tolerance for this)     Kinesiotex  Edema      Kinesiotix   Edema  2 squid shaped pieces placed on either side of Lt knee  incision with 10% stretch to decrease edema and decrease pain/sensitivity.              PT Education - 06/17/17 1151    Education provided  Yes    Education Details  kinesiotape info    Person(s) Educated  Patient    Methods  Explanation;Handout    Comprehension  Verbalized understanding       PT Short Term Goals - 06/17/17 1154      PT SHORT TERM GOAL #1   Title  Increase AROM Lt knee to -2 deg ext to 108 deg flexion 07/24/17    Time  6    Period  Weeks    Status  On-going      PT SHORT TERM GOAL #2   Title  5/5 strength Lt LE 07/24/17    Time  6    Period  Weeks    Status  On-going      PT SHORT TERM GOAL #3   Title  Improve gait pattern with rolling walker to increase safety and independence 07/24/17    Time  6    Period  Weeks    Status  Achieved        PT Long Term Goals - 06/17/17 1201      PT LONG TERM GOAL #1   Title  Decrease pain in Lt knee allowing patient to preform normal functioinal activities 09/04/17    Time  12    Period  Weeks    Status  On-going      PT LONG TERM GOAL #2   Title  Increase AROM Lt knee to 0 deg extension to 115 deg flexion 09/04/17    Time  12    Period  Weeks    Status  On-going      PT LONG TERM GOAL #3   Title  Independent gait without assistive device and good gait pattern 09/04/17    Time  12    Period  Weeks    Status  Partially Met      PT LONG TERM GOAL #4   Title  Independent in HEP 09/04/17    Time  12    Period  Weeks    Status  On-going      PT LONG TERM GOAL #5   Title  Improve FOTO to </= 51% limitation 09/04/17    Time  12    Period  Weeks    Status  On-going            Plan - 06/17/17 1122    Clinical Impression Statement  Gradual improvement of Lt knee ROM. Pt tolerated exercises well, with min increase in pain in Lt knee. She had very tight quads; Lt knee to 58 deg with prone stretch.  Pt declined use of Vaso at end of session; too painful last time. Pt has met STG #3 and is progressing  towards goals.     Rehab Potential  Good    PT Frequency  2x / week    PT Duration  12 weeks    PT Treatment/Interventions  Patient/family education;ADLs/Self Care Home Management;Cryotherapy;Electrical Stimulation;Iontophoresis 5m/ml Dexamethasone;Moist Heat;Ultrasound;Dry needling;Manual techniques;Neuromuscular re-education;Functional mobility training;Therapeutic activities;Therapeutic exercise;Gait training;Stair training    PT Next Visit Plan  MD note for appt on 11/27. continue progressive ROM/strengthening for Lt knee.     Consulted and Agree with Plan of Care  Patient       Patient will benefit from skilled therapeutic intervention in order to improve the following deficits and impairments:  Postural dysfunction, Improper body mechanics, Increased fascial restricitons, Increased muscle spasms, Decreased mobility, Decreased range of motion, Abnormal gait, Decreased activity tolerance  Visit Diagnosis: Left knee pain, unspecified chronicity  Muscle weakness (generalized)  Other symptoms and signs involving the musculoskeletal system  Other abnormalities of gait and mobility     Problem List Patient Active Problem List   Diagnosis Date Noted  . Elevated fasting glucose 06/16/2017  . 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 Elevated liver enzymes 06/16/2017  . Serum albumin decreased 06/16/2017  . 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 Degenerative arthritis of left knee 05/29/2017  . Pain of left thumb 03/23/2017  . Normocytic anemia 03/22/2017  . Bladder cancer ([place] 03/20/2017  . Hematuria 03/20/2017  . 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 Iron deficiency anemia 03/20/2017  . Post-menopausal 03/20/2017  . Elevated TSH 03/20/2017  . No energy 03/20/2017  . Moderate major depression ([place] 03/20/2017  . Mild intermittent asthma without complication 027/25/3664 . Eczema 03/20/2017  . Gastroesophageal reflux disease  03/20/2017   JKerin Perna PTA 06/17/17 12:02 PM  [place] [address]Phone: [phone]  Fax:  [phone] Name: Monica JOLLYMRN: [id number] of Birth: [birth year]

## 2017-06-21 ENCOUNTER — Other Ambulatory Visit (HOSPITAL_COMMUNITY): Payer: Self-pay | Admitting: Psychiatry

## 2017-06-21 DIAGNOSIS — F321 Major depressive disorder, single episode, moderate: Secondary | ICD-10-CM

## 2017-06-22 ENCOUNTER — Encounter: Payer: Self-pay | Admitting: Physical Therapy

## 2017-06-23 LAB — CBC
HEMATOCRIT: 28.8 % — AB (ref 35.0–45.0)
HEMOGLOBIN: 9 g/dL — AB (ref 11.7–15.5)
MCH: 25.4 pg — ABNORMAL LOW (ref 27.0–33.0)
MCHC: 31.3 g/dL — ABNORMAL LOW (ref 32.0–36.0)
MCV: 81.4 fL (ref 80.0–100.0)
MPV: 9.4 fL (ref 7.5–12.5)
Platelets: 846 10*3/uL — ABNORMAL HIGH (ref 140–400)
RBC: 3.54 10*6/uL — AB (ref 3.80–5.10)
RDW: 15.3 % — AB (ref 11.0–15.0)
WBC: 7.7 10*3/uL (ref 3.8–10.8)

## 2017-06-23 LAB — COMPLETE METABOLIC PANEL WITH GFR
AG RATIO: 1.1 (calc) (ref 1.0–2.5)
ALBUMIN MSPROF: 3.3 g/dL — AB (ref 3.6–5.1)
ALKALINE PHOSPHATASE (APISO): 150 U/L — AB (ref 33–130)
ALT: 12 U/L (ref 6–29)
AST: 16 U/L (ref 10–35)
BILIRUBIN TOTAL: 0.3 mg/dL (ref 0.2–1.2)
BUN: 9 mg/dL (ref 7–25)
CO2: 25 mmol/L (ref 20–32)
CREATININE: 0.88 mg/dL (ref 0.50–0.99)
Calcium: 8.9 mg/dL (ref 8.6–10.4)
Chloride: 103 mmol/L (ref 98–110)
GFR, EST NON AFRICAN AMERICAN: 68 mL/min/{1.73_m2} (ref >=60)
GFR, Est African American: 79 mL/min/{1.73_m2} (ref >=60)
GLOBULIN: 2.9 g/dL (ref 1.9–3.7)
Glucose, Bld: 106 mg/dL — ABNORMAL HIGH (ref 65–99)
Potassium: 4.4 mmol/L (ref 3.5–5.3)
SODIUM: 137 mmol/L (ref 135–146)
Total Protein: 6.2 g/dL (ref 6.1–8.1)

## 2017-06-23 LAB — LIPID PANEL W/REFLEX DIRECT LDL
CHOL/HDL RATIO: 4.9 (calc) (ref <5.0)
CHOLESTEROL: 151 mg/dL (ref <200)
HDL: 31 mg/dL — ABNORMAL LOW (ref >50)
LDL CHOLESTEROL (CALC): 90 mg/dL
Non-HDL Cholesterol (Calc): 120 mg/dL (calc) (ref <130)
TRIGLYCERIDES: 209 mg/dL — AB (ref <150)

## 2017-06-23 LAB — TEST AUTHORIZATION

## 2017-06-23 LAB — HEMOGLOBIN A1C W/OUT EAG: HEMOGLOBIN A1C: 5.5 %{Hb} (ref <5.7)

## 2017-06-24 ENCOUNTER — Other Ambulatory Visit (HOSPITAL_COMMUNITY): Payer: Self-pay | Admitting: Psychiatry

## 2017-06-24 ENCOUNTER — Encounter: Payer: Self-pay | Admitting: Rehabilitative and Restorative Service Providers"

## 2017-06-24 ENCOUNTER — Other Ambulatory Visit (HOSPITAL_COMMUNITY): Payer: Self-pay

## 2017-06-24 MED ORDER — BUPROPION HCL ER (SR) 150 MG PO TB12: 150.0000 mg | ORAL_TABLET | Freq: Every day | ORAL | 0 refills | Status: DC

## 2017-06-24 NOTE — Telephone Encounter (Signed)
Pharmacy sent fax requesting refill on Bupropion SR 150mg . Medication sent for one month refill. Patient next appointment is on 06/29/17. Nothing further is needed at this time.

## 2017-06-29 ENCOUNTER — Ambulatory Visit (HOSPITAL_COMMUNITY): Payer: Self-pay | Admitting: Psychiatry

## 2017-06-29 ENCOUNTER — Ambulatory Visit (INDEPENDENT_AMBULATORY_CARE_PROVIDER_SITE_OTHER): Payer: Medicare Other | Admitting: Physical Therapy

## 2017-06-29 DIAGNOSIS — M25562 Pain in left knee: Secondary | ICD-10-CM | POA: Diagnosis present

## 2017-06-29 DIAGNOSIS — R29898 Other symptoms and signs involving the musculoskeletal system: Secondary | ICD-10-CM | POA: Diagnosis not present

## 2017-06-29 DIAGNOSIS — M6281 Muscle weakness (generalized): Secondary | ICD-10-CM | POA: Diagnosis not present

## 2017-06-29 NOTE — Therapy (Signed)
[place], [place], [address] Phone: [phone]   Fax:  [phone]  Physical Therapy Treatment  Patient Details  Name: Monica Page MRN: [id number] Date of Birth: [birth year] Referring Provider: Dr. Mayer Camel   Encounter Date: 06/29/2017  PT End of Session - 06/29/17 1436    Visit Number  4    Number of Visits  24    Date for PT Re-Evaluation  09/04/17    PT Start Time  1430    PT Stop Time  1526    PT Time Calculation (min)  56 min    Activity Tolerance  Patient tolerated treatment well    Behavior During Therapy  [facility] for tasks assessed/performed       Past Medical History:  Diagnosis Date  . Anemia    hx. of anemia  . Anxiety   . Arthritis   . Asthma   . 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 Complication of anesthesia    asthma attack coming out of anesthesia  . Depression   . GERD (gastroesophageal reflux disease)   . Sleep apnea    CPAP does not use    Past Surgical History:  Procedure Laterality Date  . broken elbow Right   . brooken toe Right   . EYE SURGERY    . infected knee relpacement Right 2017  . JOINT REPLACEMENT Right 07/29/2015   knee  . 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 TOTAL KNEE ARTHROPLASTY Left 06/01/2017   Procedure: TOTAL KNEE ARTHROPLASTY;  Surgeon: Frederik Pear, MD;  Location: [facility];  Service: Orthopedics;  Laterality: Left;  . TUBAL LIGATION      There were no vitals filed for this visit.  Subjective Assessment - 06/29/17 1436    Subjective  Pt has finished her antibiotics.  She reports the Rock tape helped reduce swelling and pain. She is taking 2 Aleve in morning and 2 in evening for pain. She is curious what her range is in knee. She would like to work more on strengthening.    Currently in Pain?  Yes    Pain Score  3     Pain Location  Knee    Pain Orientation  Left    Pain Descriptors / Indicators  Aching    Aggravating Factors   ??     Pain Relieving Factors  medicine, ice          [facility] PT Assessment - 06/29/17 0001      Assessment   Medical Diagnosis  Lt TKA     Referring Provider  Dr. Mayer Camel    Onset Date/Surgical Date  06/01/17    Hand Dominance  Right    Next MD Visit  06/30/17    Prior Therapy  post op hospital; HHPT x 5 visits       Precautions   Precautions  None      ROM / Strength   AROM / PROM / Strength  AROM;Strength      AROM   Right Knee Flexion  125    Left Knee Extension  -3 supine with quad set     Left Knee Flexion  130 supine with AAROM heel slide      Strength   Strength Assessment Site  Knee;Hip    Right/Left Hip  Left    Left Hip Flexion  5/5    Left Hip Extension  -- 5-/5    Left Hip ABduction  --  5-/5    Right/Left Knee  Left    Left Knee Flexion  5/5    Left Knee Extension  5/5      Flexibility   Quadriceps  Lt knee 90 deg prone  (58 deg last assessment on 11/21)                  [facility] Adult PT Treatment/Exercise - 06/29/17 0001      Knee/Hip Exercises: Stretches   Passive Hamstring Stretch  Right;2 reps;30 seconds    Quad Stretch  Right;Left;2 reps;30 seconds    Gastroc Stretch  Right;Left;2 reps;30 seconds VC for form      Knee/Hip Exercises: Aerobic   Recumbent Bike  L1: 5.5 min       Knee/Hip Exercises: Standing   Heel Raises  Both;1 set;15 reps and toe raises    Hip Abduction  Stengthening;Right;Left;1 set;10 reps    Abduction Limitations  VC for posture, form and breath    Lateral Step Up  Left;1 set;15 reps;Hand Hold: 2;Step Height: 6" VC to slow down.     Step Down  Right;1 set;15 reps;Hand Hold: 2;Step Height: 6"    SLS  Rt/Lt SLS - 3 trials, up to 7 sec each leg.  SLS with opp toe tap to 6" step x 10 reps, each leg, 2 sets - VC for slow controlled motion, light tap.     Other Standing Knee Exercises  tandem stance balance x 30 sec x 2 reps each leg.       Knee/Hip Exercises: Supine   Quad Sets  Left;1 set;5 reps    Heel Slides   AAROM;Left;5 reps 10 sec hold       Knee/Hip Exercises: Sidelying   Hip ABduction  -- 2 reps; poor form; switched to standing      Modalities   Modalities  -- pt declined; will use ice at home.       Manual Therapy   Passive ROM  overpressure into Lt knee  ext x 15 sec x 4 reps (pt had limited tolerance for this)       Kinesiotix   Edema  2 squid shaped pieces placed on either side of Lt knee incision with 10% stretch to decrease edema and decrease pain/sensitivity. I strip of Rock tape applied with 50% stretch to L pes anserine, I strip applied with zig zag pattern over superior incision to assist with scar managment.                PT Short Term Goals - 06/29/17 1457      PT SHORT TERM GOAL #1   Title  Increase AROM Lt knee to -2 deg ext to 108 deg flexion 07/24/17    Time  6    Period  Weeks    Status  Partially Met      PT SHORT TERM GOAL #2   Title  5/5 strength Lt LE 07/24/17    Time  6    Period  Weeks    Status  Partially Met      PT SHORT TERM GOAL #3   Title  Improve gait pattern with rolling walker to increase safety and independence 07/24/17    Time  6    Period  Weeks    Status  Achieved        PT Long Term Goals - 06/29/17 1455      PT LONG TERM GOAL #1   Title  Decrease pain in Lt knee  allowing patient to preform normal functioinal activities 09/04/17    Time  12    Period  Weeks    Status  Achieved      PT LONG TERM GOAL #2   Title  Increase AROM Lt knee to 0 deg extension to 115 deg flexion 09/04/17    Time  12    Period  Weeks    Status  Partially Met      PT LONG TERM GOAL #3   Title  Independent gait without assistive device and good gait pattern 09/04/17    Time  12    Period  Weeks    Status  Partially Met      PT LONG TERM GOAL #4   Title  Independent in HEP 09/04/17    Time  12    Period  Weeks    Status  On-going      PT LONG TERM GOAL #5   Title  Improve FOTO to </= 51% limitation 09/04/17    Time  12    Period  Weeks     Status  On-going            Plan - 06/29/17 1526    Clinical Impression Statement  Monica Page demonstrated improved Lt knee ROM; has partially  met her ROM goals.  She tolerated exercises with slight increase in pain.  Pt declinded modalities; will use ice at home. Pt reported reduction of pain with use of Rock tape to anterior Left knee.  Pt has partially met her goals and is making good progress towards remaining goals.  Pt will benefit from continued PT intervention to max functional mobility.     Rehab Potential  Good    PT Frequency  2x / week    PT Duration  12 weeks    PT Treatment/Interventions  Patient/family education;ADLs/Self Care Home Management;Cryotherapy;Electrical Stimulation;Iontophoresis 43m/ml Dexamethasone;Moist Heat;Ultrasound;Dry needling;Manual techniques;Neuromuscular re-education;Functional mobility training;Therapeutic activities;Therapeutic exercise;Gait training;Stair training    PT Next Visit Plan  continue progressive ROM/strengthening for Lt knee, including proprioceptive training.     Consulted and Agree with Plan of Care  Patient       Patient will benefit from skilled therapeutic intervention in order to improve the following deficits and impairments:  Postural dysfunction, Improper body mechanics, Increased fascial restricitons, Increased muscle spasms, Decreased mobility, Decreased range of motion, Abnormal gait, Decreased activity tolerance  Visit Diagnosis: Left knee pain, unspecified chronicity  Muscle weakness (generalized)  Other symptoms and signs involving the musculoskeletal system     Problem List Patient Active Problem List   Diagnosis Date Noted  . Elevated fasting glucose 06/16/2017  . 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 Elevated liver enzymes 06/16/2017  . Serum albumin decreased 06/16/2017  . 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 Degenerative arthritis of left knee 05/29/2017  . Pain of left thumb 03/23/2017  .  Normocytic anemia 03/22/2017  . Bladder cancer ([place] 03/20/2017  . Hematuria 03/20/2017  . 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 Iron deficiency anemia 03/20/2017  . Post-menopausal 03/20/2017  . Elevated TSH 03/20/2017  . No energy 03/20/2017  . Moderate major depression ([place] 03/20/2017  . Mild intermittent asthma without complication 052/84/1324 . Eczema 03/20/2017  . Gastroesophageal reflux disease 03/20/2017   JKerin Perna PTA 06/29/17 3:31 PM  [facility] [place] [place] [place] [address]Phone: [phone]  Fax:  [phone] Name: Monica CRANFIELDMRN:  [id number] Date of Birth: 29-Aug-1950

## 2017-06-30 DIAGNOSIS — Z96652 Presence of left artificial knee joint: Secondary | ICD-10-CM | POA: Diagnosis not present

## 2017-06-30 DIAGNOSIS — Z471 Aftercare following joint replacement surgery: Secondary | ICD-10-CM | POA: Diagnosis not present

## 2017-07-01 ENCOUNTER — Encounter: Payer: Self-pay | Admitting: Physical Therapy

## 2017-07-02 ENCOUNTER — Ambulatory Visit (INDEPENDENT_AMBULATORY_CARE_PROVIDER_SITE_OTHER): Payer: Medicare Other | Admitting: Physical Therapy

## 2017-07-02 VITALS — HR 112

## 2017-07-02 DIAGNOSIS — R29898 Other symptoms and signs involving the musculoskeletal system: Secondary | ICD-10-CM | POA: Diagnosis not present

## 2017-07-02 DIAGNOSIS — M6281 Muscle weakness (generalized): Secondary | ICD-10-CM

## 2017-07-02 DIAGNOSIS — M25562 Pain in left knee: Secondary | ICD-10-CM | POA: Diagnosis present

## 2017-07-02 DIAGNOSIS — R2689 Other abnormalities of gait and mobility: Secondary | ICD-10-CM

## 2017-07-02 NOTE — Therapy (Signed)
[place], [place], [address] Phone: [phone]   Fax:  [phone]  Physical Therapy Treatment  Patient Details  Name: Monica Page MRN: [id number] Date of Birth: [birth year] Referring Provider: Dr. Mayer Camel   Encounter Date: 07/02/2017  PT End of Session - 07/02/17 1733    Visit Number  5    Number of Visits  24    Date for PT Re-Evaluation  09/04/17    PT Start Time  1402    PT Stop Time  1445    PT Time Calculation (min)  43 min    Activity Tolerance  Patient tolerated treatment well    Behavior During Therapy  [facility] for tasks assessed/performed       Past Medical History:  Diagnosis Date  . Anemia    hx. of anemia  . Anxiety   . Arthritis   . Asthma   . 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 Complication of anesthesia    asthma attack coming out of anesthesia  . Depression   . GERD (gastroesophageal reflux disease)   . Sleep apnea    CPAP does not use    Past Surgical History:  Procedure Laterality Date  . broken elbow Right   . brooken toe Right   . EYE SURGERY    . infected knee relpacement Right 2017  . JOINT REPLACEMENT Right 07/29/2015   knee  . 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 TOTAL KNEE ARTHROPLASTY Left 06/01/2017   Procedure: TOTAL KNEE ARTHROPLASTY;  Surgeon: Frederik Pear, MD;  Location: [facility];  Service: Orthopedics;  Laterality: Left;  . TUBAL LIGATION      Vitals:   07/02/17 1410  Pulse: (!) 112 - pt riding bicycle  SpO2: 93%    Subjective Assessment - 07/02/17 1408    Subjective  "I'm feeling kinda bleh".   Pt reports she feels lethargic today. She was told she has post-surgery enemia and has started taking Iron tablets.  She states her MD was pleased with her progress and will see her again in 6 wks.     Currently in Pain?  Yes    Pain Score  1     Pain Location  Knee    Pain Orientation  Left;Medial    Pain Descriptors / Indicators   Aching;Dull    Aggravating Factors   ??    Pain Relieving Factors  ice          [facility] PT Assessment - 07/02/17 0001      Assessment   Medical Diagnosis  Lt TKA     Referring Provider  Dr. Mayer Camel    Onset Date/Surgical Date  06/01/17    Hand Dominance  Right    Next MD Visit  08/11/16      AROM   Right Knee Flexion  125    Left Knee Extension  -3       [facility] Adult PT Treatment/Exercise - 07/02/17 0001      Knee/Hip Exercises: Stretches   Quad Stretch  Right;Left;2 reps;30 seconds    Gastroc Stretch  Right;Left;2 reps;30 seconds      Knee/Hip Exercises: Aerobic   Recumbent Bike  L1: 5.5 min     Other Aerobic  laps around gym in between exercises to decrease stiffness.       Knee/Hip Exercises: Standing   Lateral Step Up  Left;1 set;15 reps;Hand Hold: 1;Step Height: 8"    Forward Step Up  Left;1 set;15 reps;Hand Hold: 1;Step Height: 8"    SLS  Rt/Lt SLS - multiple trials up to 13 sec RLE, 5 sec LLE    Other Standing Knee Exercises  tandem stance balance x 30 sec x 2 reps each leg; repeated on mini-tramp, then with horiz head turns.     Other Standing Knee Exercises  side stepping with red band x 20 ft Rt/Lt, repeated with blue band - VC to slow speed of exercise.       Knee/Hip Exercises: Supine   Quad Sets  Left;1 set;5 reps 10 sec holds    Heel Slides  AAROM;Left;5 reps 10 sec hold       Modalities   Modalities  -- pt declined; will use ice at home.       Manual Therapy   Passive ROM  overpressure into Lt knee  ext x 15 sec x 4 reps      Kinesiotix   Edema  2 squid shaped pieces placed on either side of Lt knee incision with 10% stretch to decrease edema and decrease pain/sensitivity. I strip of Rock tape applied with 50% stretch to L pes anserine, I strip applied with zig zag pattern over superior incision to assist with scar managment.                PT Short Term Goals - 06/29/17 1457      PT SHORT TERM GOAL #1   Title  Increase AROM Lt knee to -2 deg  ext to 108 deg flexion 07/24/17    Time  6    Period  Weeks    Status  Partially Met      PT SHORT TERM GOAL #2   Title  5/5 strength Lt LE 07/24/17    Time  6    Period  Weeks    Status  Partially Met      PT SHORT TERM GOAL #3   Title  Improve gait pattern with rolling walker to increase safety and independence 07/24/17    Time  6    Period  Weeks    Status  Achieved        PT Long Term Goals - 06/29/17 1455      PT LONG TERM GOAL #1   Title  Decrease pain in Lt knee allowing patient to preform normal functioinal activities 09/04/17    Time  12    Period  Weeks    Status  Achieved      PT LONG TERM GOAL #2   Title  Increase AROM Lt knee to 0 deg extension to 115 deg flexion 09/04/17    Time  12    Period  Weeks    Status  Partially Met      PT LONG TERM GOAL #3   Title  Independent gait without assistive device and good gait pattern 09/04/17    Time  12    Period  Weeks    Status  Partially Met      PT LONG TERM GOAL #4   Title  Independent in HEP 09/04/17    Time  12    Period  Weeks    Status  On-going      PT LONG TERM GOAL #5   Title  Improve FOTO to </= 51% limitation 09/04/17    Time  12    Period  Weeks    Status  On-going  Plan - 07/02/17 1442    Clinical Impression Statement  Pt's Lt knee ROM similar to last session.  She continues with decreased standing static balance; tandem stance improving however SLS still difficult. Pt progressing well towards therapy goals.     Rehab Potential  Good    PT Frequency  2x / week    PT Duration  12 weeks    PT Treatment/Interventions  Patient/family education;ADLs/Self Care Home Management;Cryotherapy;Electrical Stimulation;Iontophoresis 50m/ml Dexamethasone;Moist Heat;Ultrasound;Dry needling;Manual techniques;Neuromuscular re-education;Functional mobility training;Therapeutic activities;Therapeutic exercise;Gait training;Stair training    PT Next Visit Plan  continue progressive ROM/strengthening for Lt  knee, including proprioceptive training.  Assess strength.   Consulted and Agree with Plan of Care  Patient       Patient will benefit from skilled therapeutic intervention in order to improve the following deficits and impairments:  Postural dysfunction, Improper body mechanics, Increased fascial restricitons, Increased muscle spasms, Decreased mobility, Decreased range of motion, Abnormal gait, Decreased activity tolerance  Visit Diagnosis: Left knee pain, unspecified chronicity  Muscle weakness (generalized)  Other symptoms and signs involving the musculoskeletal system  Other abnormalities of gait and mobility     Problem List Patient Active Problem List   Diagnosis Date Noted  . Elevated fasting glucose 06/16/2017  . 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 Elevated liver enzymes 06/16/2017  . Serum albumin decreased 06/16/2017  . 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 Degenerative arthritis of left knee 05/29/2017  . Pain of left thumb 03/23/2017  . Normocytic anemia 03/22/2017  . Bladder cancer ([place] 03/20/2017  . Hematuria 03/20/2017  . 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 Iron deficiency anemia 03/20/2017  . Post-menopausal 03/20/2017  . Elevated TSH 03/20/2017  . No energy 03/20/2017  . Moderate major depression ([place] 03/20/2017  . Mild intermittent asthma without complication 013/14/3888 . Eczema 03/20/2017  . Gastroesophageal reflux disease 03/20/2017   JKerin Perna PTA 07/02/17 5:36 PM  [place] [address]Phone: [phone]  Fax:  [phone] Name: Monica GRADELMRN: [id number] of Birth: [birth year]

## 2017-07-03 ENCOUNTER — Encounter (HOSPITAL_COMMUNITY): Payer: Self-pay | Admitting: Psychiatry

## 2017-07-03 ENCOUNTER — Ambulatory Visit (INDEPENDENT_AMBULATORY_CARE_PROVIDER_SITE_OTHER): Payer: Medicare Other | Admitting: Psychiatry

## 2017-07-03 DIAGNOSIS — F4310 Post-traumatic stress disorder, unspecified: Secondary | ICD-10-CM | POA: Diagnosis not present

## 2017-07-03 DIAGNOSIS — Z811 Family history of alcohol abuse and dependence: Secondary | ICD-10-CM | POA: Diagnosis not present

## 2017-07-03 DIAGNOSIS — F5101 Primary insomnia: Secondary | ICD-10-CM

## 2017-07-03 DIAGNOSIS — F321 Major depressive disorder, single episode, moderate: Secondary | ICD-10-CM

## 2017-07-03 DIAGNOSIS — Z818 Family history of other mental and behavioral disorders: Secondary | ICD-10-CM | POA: Diagnosis not present

## 2017-07-03 DIAGNOSIS — F411 Generalized anxiety disorder: Secondary | ICD-10-CM

## 2017-07-03 MED ORDER — TRAZODONE HCL 50 MG PO TABS: 50.0000 mg | ORAL_TABLET | Freq: Every day | ORAL | 0 refills | Status: DC

## 2017-07-03 MED ORDER — BUPROPION HCL ER (SR) 150 MG PO TB12: 150.0000 mg | ORAL_TABLET | Freq: Every day | ORAL | 0 refills | Status: DC

## 2017-07-03 MED ORDER — MIRTAZAPINE 15 MG PO TABS: ORAL_TABLET | ORAL | 0 refills | Status: DC

## 2017-07-03 MED ORDER — DULOXETINE HCL 60 MG PO CPEP
60.0000 mg | ORAL_CAPSULE | Freq: Two times a day (BID) | ORAL | 0 refills | Status: DC
Start: 2017-07-03 — End: 2017-12-15
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
## 2017-07-06 ENCOUNTER — Encounter: Payer: Self-pay | Admitting: Physical Therapy

## 2017-07-08 ENCOUNTER — Encounter: Payer: Self-pay | Admitting: Physical Therapy

## 2017-07-13 ENCOUNTER — Encounter: Payer: Self-pay | Admitting: Physical Therapy

## 2017-07-15 ENCOUNTER — Ambulatory Visit (INDEPENDENT_AMBULATORY_CARE_PROVIDER_SITE_OTHER): Payer: Medicare Other | Admitting: Physical Therapy

## 2017-07-15 DIAGNOSIS — M6281 Muscle weakness (generalized): Secondary | ICD-10-CM | POA: Diagnosis not present

## 2017-07-15 DIAGNOSIS — R29898 Other symptoms and signs involving the musculoskeletal system: Secondary | ICD-10-CM

## 2017-07-15 DIAGNOSIS — M25562 Pain in left knee: Secondary | ICD-10-CM

## 2017-07-15 DIAGNOSIS — R2689 Other abnormalities of gait and mobility: Secondary | ICD-10-CM

## 2017-07-15 DIAGNOSIS — F331 Major depressive disorder, recurrent, moderate: Secondary | ICD-10-CM | POA: Diagnosis not present

## 2017-07-15 NOTE — Therapy (Addendum)
[place], [place], [address] Phone: [phone]   Fax:  [phone]  Physical Therapy Treatment  Patient Details  Name: Monica Page MRN: [id number] Date of Birth: September 03, 1950 Referring Provider: Dr. Mayer Camel   Encounter Date: 07/15/2017  PT End of Session - 07/15/17 1438    Visit Number  6    Number of Visits  24    Date for PT Re-Evaluation  09/04/17    PT Start Time  1435    PT Stop Time  1519    PT Time Calculation (min)  44 min    Activity Tolerance  Patient tolerated treatment well    Behavior During Therapy  [facility] for tasks assessed/performed       Past Medical History:  Diagnosis Date  . Anemia    hx. of anemia  . Anxiety   . Arthritis   . Asthma   . 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 Complication of anesthesia    asthma attack coming out of anesthesia  . Depression   . GERD (gastroesophageal reflux disease)   . Sleep apnea    CPAP does not use    Past Surgical History:  Procedure Laterality Date  . broken elbow Right   . brooken toe Right   . EYE SURGERY    . infected knee relpacement Right 2017  . JOINT REPLACEMENT Right 07/29/2015   knee  . 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 TOTAL KNEE ARTHROPLASTY Left 06/01/2017   Procedure: TOTAL KNEE ARTHROPLASTY;  Surgeon: Frederik Pear, MD;  Location: [place];  Service: Orthopedics;  Laterality: Left;  . TUBAL LIGATION      There were no vitals filed for this visit.  Subjective Assessment - 07/15/17 1439    Subjective  Pt reports she still feels like her energy level is not quite what it should be. She is taking vitamins and iron.  she still has a rash on her Lt leg and it is now on her neck; "the doctors don't know what it is".    She is still having diffiulty sleeping.     Patient Stated Goals  get rid og knee pain and tightness; get knee moving again     Currently in Pain?  Yes    Pain Score  2     Pain Location   Knee    Pain Orientation  Right;Medial    Pain Descriptors / Indicators  Aching    Aggravating Factors   ?    Pain Relieving Factors  ice          [facility] PT Assessment - 07/15/17 0001      Assessment   Medical Diagnosis  Lt TKA     Referring Provider  Dr. Mayer Camel    Onset Date/Surgical Date  06/01/17    Hand Dominance  Right    Next MD Visit  08/11/16      AROM   Right Knee Flexion  127    Left Knee Extension  0        [facility] Adult PT Treatment/Exercise - 07/15/17 0001      Knee/Hip Exercises: Stretches   Passive Hamstring Stretch  Right;2 reps;30 seconds    Quad Stretch  Right;Left;2 reps;30 seconds    Gastroc Stretch  Right;Left;2 reps;30 seconds      Knee/Hip Exercises: Aerobic   Recumbent Bike  L2: 5.5 min  PTA present to discuss progress    Other Aerobic  laps around gym in between exercises to decrease stiffness.       Knee/Hip Exercises: Standing   Lateral Step Up  Left;2 sets;10 reps;Hand Hold: 2;Step Height: 6"    Step Down  Right;Hand Hold: 2;Step Height: 6";10 reps;2 sets    SLS  3 trials of SLS - Rt  up to 26 sec, Lt up to 8 sec    Other Standing Knee Exercises  tandem stance balance x 30 sec x 2 reps each leg on mini-tramp, then with horiz head turns.       Knee/Hip Exercises: Prone   Prone Knee Hang  1 minute 3 reps; patella off of table.       Modalities   Modalities  -- pt declined; will use ice at home.       Manual Therapy   Passive ROM  overpressure into Lt knee  ext x 15 sec x 4 reps      Kinesiotix   Edema  I strip of Regular rock tape applied to medial Lt knee to decompress tissue and decrease pain.                PT Short Term Goals - 06/29/17 1457      PT SHORT TERM GOAL #1   Title  Increase AROM Lt knee to -2 deg ext to 108 deg flexion 07/24/17    Time  6    Period  Weeks    Status  Achieved     PT SHORT TERM GOAL #2   Title  5/5 strength Lt LE 07/24/17    Time  6    Period  Weeks    Status  Partially Met      PT SHORT  TERM GOAL #3   Title  Improve gait pattern with rolling walker to increase safety and independence 07/24/17    Time  6    Period  Weeks    Status  Achieved        PT Long Term Goals - 07/15/17 1504      PT LONG TERM GOAL #1   Title  Decrease pain in Lt knee allowing patient to preform normal functioinal activities 09/04/17    Time  12    Period  Weeks    Status  Achieved      PT LONG TERM GOAL #2   Title  Increase AROM Lt knee to 0 deg extension to 115 deg flexion 09/04/17    Time  12    Period  Weeks    Status  Achieved      PT LONG TERM GOAL #3   Title  Independent gait without assistive device and good gait pattern 09/04/17    Time  12    Period  Weeks    Status  Partially Met      PT LONG TERM GOAL #4   Title  Independent in HEP 09/04/17    Time  12    Period  Weeks    Status  On-going      PT LONG TERM GOAL #5   Title  Improve FOTO to </= 51% limitation 09/04/17    Time  12    Period  Weeks    Status  On-going            Plan - 07/15/17 1555    Clinical Impression Statement  Pt's Lt knee ROM has improved.  She is reporting 85% improvement in function in knee with everyday tasks.  She tolerated  exercises well, with min increase in pain. Pt making good gains towards remaining goals and states she may be ready for d/c next visit.      Rehab Potential  Good    PT Frequency  2x / week    PT Duration  12 weeks    PT Treatment/Interventions  Patient/family education;ADLs/Self Care Home Management;Cryotherapy;Electrical Stimulation;Iontophoresis 32m/ml Dexamethasone;Moist Heat;Ultrasound;Dry needling;Manual techniques;Neuromuscular re-education;Functional mobility training;Therapeutic activities;Therapeutic exercise;Gait training;Stair training    PT Next Visit Plan  assess readiness to d/c vs 2 wk hold.  FOTO.         Patient will benefit from skilled therapeutic intervention in order to improve the following deficits and impairments:  Postural dysfunction, Improper body  mechanics, Increased fascial restricitons, Increased muscle spasms, Decreased mobility, Decreased range of motion, Abnormal gait, Decreased activity tolerance  Visit Diagnosis: Left knee pain, unspecified chronicity  Muscle weakness (generalized)  Other symptoms and signs involving the musculoskeletal system  Other abnormalities of gait and mobility     Problem List Patient Active Problem List   Diagnosis Date Noted  . Elevated fasting glucose 06/16/2017  . 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 Elevated liver enzymes 06/16/2017  . Serum albumin decreased 06/16/2017  . 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 Degenerative arthritis of left knee 05/29/2017  . Pain of left thumb 03/23/2017  . Normocytic anemia 03/22/2017  . Bladder cancer ([place] 03/20/2017  . Hematuria 03/20/2017  . 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 Iron deficiency anemia 03/20/2017  . Post-menopausal 03/20/2017  . Elevated TSH 03/20/2017  . No energy 03/20/2017  . Moderate major depression ([place] 03/20/2017  . Mild intermittent asthma without complication 006/77/0340 . Eczema 03/20/2017  . Gastroesophageal reflux disease 03/20/2017   JKerin Perna PTA 07/15/17 3:59 PM  [facility] [place] [address] [address]Phone: [phone]  Fax:  [phone] Name: Monica DERRYBERRYMRN: [id number] of Birth: 1Jan 27, 1952 PHYSICAL THERAPY DISCHARGE SUMMARY  Visits from Start of Care: 6  Current functional level related to goals / functional outcomes: See last progress note for discharge status   Remaining deficits: Unknown    Education / Equipment: HEP  Plan: Patient agrees to discharge.  Patient goals were partially met. Patient is being discharged due to being pleased with the current functional level.  ?????    Celyn P. HHelene KelpPT, MPH 09/03/17 4:03 PM

## 2017-07-17 ENCOUNTER — Encounter: Payer: 59 | Admitting: Rehabilitative and Restorative Service Providers"

## 2017-07-22 ENCOUNTER — Encounter: Payer: Self-pay | Admitting: Rehabilitative and Restorative Service Providers"

## 2017-07-24 ENCOUNTER — Encounter: Payer: Self-pay | Admitting: Physical Therapy

## 2017-09-01 ENCOUNTER — Encounter: Payer: Self-pay | Admitting: Family Medicine

## 2017-09-01 DIAGNOSIS — Z0189 Encounter for other specified special examinations: Secondary | ICD-10-CM

## 2017-09-25 ENCOUNTER — Ambulatory Visit (HOSPITAL_COMMUNITY): Payer: Self-pay | Admitting: Psychiatry

## 2017-10-08 ENCOUNTER — Ambulatory Visit (HOSPITAL_COMMUNITY): Payer: Self-pay | Admitting: Psychiatry

## 2017-10-28 ENCOUNTER — Other Ambulatory Visit (HOSPITAL_COMMUNITY): Payer: Self-pay | Admitting: Psychiatry

## 2017-10-28 DIAGNOSIS — F321 Major depressive disorder, single episode, moderate: Secondary | ICD-10-CM

## 2017-12-11 ENCOUNTER — Ambulatory Visit: Payer: Self-pay | Admitting: Physician Assistant

## 2017-12-15 ENCOUNTER — Ambulatory Visit (INDEPENDENT_AMBULATORY_CARE_PROVIDER_SITE_OTHER): Payer: Medicare Other | Admitting: Physician Assistant

## 2017-12-15 ENCOUNTER — Encounter: Payer: Self-pay | Admitting: Physician Assistant

## 2017-12-15 VITALS — BP 148/78 | HR 110 | Ht 63.0 in | Wt 185.0 lb

## 2017-12-15 DIAGNOSIS — Z79899 Other long term (current) drug therapy: Secondary | ICD-10-CM | POA: Diagnosis not present

## 2017-12-15 DIAGNOSIS — Z1231 Encounter for screening mammogram for malignant neoplasm of breast: Secondary | ICD-10-CM

## 2017-12-15 DIAGNOSIS — L659 Nonscarring hair loss, unspecified: Secondary | ICD-10-CM

## 2017-12-15 DIAGNOSIS — F5101 Primary insomnia: Secondary | ICD-10-CM | POA: Diagnosis not present

## 2017-12-15 DIAGNOSIS — F321 Major depressive disorder, single episode, moderate: Secondary | ICD-10-CM

## 2017-12-15 DIAGNOSIS — J4520 Mild intermittent asthma, uncomplicated: Secondary | ICD-10-CM | POA: Diagnosis not present

## 2017-12-15 DIAGNOSIS — Z1159 Encounter for screening for other viral diseases: Secondary | ICD-10-CM | POA: Diagnosis not present

## 2017-12-15 DIAGNOSIS — R030 Elevated blood-pressure reading, without diagnosis of hypertension: Secondary | ICD-10-CM

## 2017-12-15 DIAGNOSIS — M545 Low back pain, unspecified: Secondary | ICD-10-CM

## 2017-12-15 DIAGNOSIS — D509 Iron deficiency anemia, unspecified: Secondary | ICD-10-CM

## 2017-12-15 DIAGNOSIS — M2170 Unequal limb length (acquired), unspecified site: Secondary | ICD-10-CM | POA: Diagnosis not present

## 2017-12-15 MED ORDER — DULOXETINE HCL 60 MG PO CPEP: 60.0000 mg | ORAL_CAPSULE | Freq: Two times a day (BID) | ORAL | 1 refills | Status: DC

## 2017-12-15 MED ORDER — BUDESONIDE-FORMOTEROL FUMARATE 80-4.5 MCG/ACT IN AERO: 2.0000 | INHALATION_SPRAY | Freq: Two times a day (BID) | RESPIRATORY_TRACT | 0 refills | Status: DC

## 2017-12-15 MED ORDER — CELECOXIB 200 MG PO CAPS: 200.0000 mg | ORAL_CAPSULE | Freq: Two times a day (BID) | ORAL | 0 refills | Status: DC

## 2017-12-15 MED ORDER — BUPROPION HCL ER (SR) 150 MG PO TB12: 150.0000 mg | ORAL_TABLET | Freq: Every day | ORAL | 1 refills | Status: DC

## 2017-12-15 MED ORDER — ZOLPIDEM TARTRATE 5 MG PO TABS: 5.0000 mg | ORAL_TABLET | Freq: Every evening | ORAL | 2 refills | Status: DC | PRN

## 2017-12-15 NOTE — Patient Instructions (Addendum)
symbicort start.  Celebrex to start.

## 2017-12-15 NOTE — Progress Notes (Signed)
Subjective:    Patient ID: Monica Page, female    DOB: 28-Apr-1951, 67 y.o.   MRN: [id number]  HPI  Pt is a 67 yo female who presents to the clinic for follow up and medication refills.   She would like to switch back to [place]. She does not feel like trazodone and remeron are helping her sleep. Overall she feels like her mood is ok. No SI/HC.   She is in some pain in her low back and feels like since her knee replacement left leg is longer than right. She wonders what we can do about this. She takes aleve multiple times a day.   She is singulair and rescue inhaler. She has had to use rescue inhaler daily. She admits to wheezing and trouble breathing off and on. She used to be on advair but was too expensive.   Pt feels overall that her energy is not what it should be. She knows some of it is due to sleep. Her hair is falling out as well.   .. Active Ambulatory Problems    Diagnosis Date Noted  . Bladder cancer ([place]) 03/20/2017  . Hematuria 03/20/2017  . 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 Iron deficiency anemia 03/20/2017  . Post-menopausal 03/20/2017  . Elevated TSH 03/20/2017  . No energy 03/20/2017  . Moderate major depression ([place]) 03/20/2017  . Mild intermittent asthma without complication 68/06/7516  . Eczema 03/20/2017  . Gastroesophageal reflux disease 03/20/2017  . Normocytic anemia 03/22/2017  . Pain of left thumb 03/23/2017  . Degenerative arthritis of left knee 05/29/2017  . 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 Elevated fasting glucose 06/16/2017  . 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 Elevated liver enzymes 06/16/2017  . Serum albumin decreased 06/16/2017  . Hair loss 12/18/2017  . Leg length discrepancy 12/18/2017  . Acute bilateral low back pain without sciatica 12/18/2017  . Elevated blood pressure reading 12/18/2017   Resolved Ambulatory Problems    Diagnosis Date Noted  . No Resolved Ambulatory  Problems   Past Medical History:  Diagnosis Date  . Anemia   . Anxiety   . Arthritis   . Asthma   . Cancer ([place]) 07/28/2004  . Complication of anesthesia   . Depression   . GERD (gastroesophageal reflux disease)   . Sleep apnea       Review of Systems  All other systems reviewed and are negative.      Objective:   Physical Exam  Constitutional: She is oriented to person, place, and time. She appears well-developed and well-nourished.  HENT:  Head: Normocephalic and atraumatic.  Cardiovascular: Normal rate and regular rhythm.  Pulmonary/Chest: Effort normal and breath sounds normal.  Neurological: She is alert and oriented to person, place, and time.  Psychiatric: She has a normal mood and affect. Her behavior is normal.          Assessment & Plan:  Marland KitchenMarland KitchenDiagnoses and all orders for this visit:  Moderate major depression (HCC) -     DULoxetine (CYMBALTA) 60 MG capsule; Take 1 capsule (60 mg total) by mouth 2 (two) times daily. -     buPROPion (WELLBUTRIN SR) 150 MG 12 hr tablet; Take 1 tablet (150 mg total) by mouth daily.  Medication management -     COMPLETE METABOLIC PANEL WITH GFR  Hair loss -     TSH  Visit for screening mammogram -     MM 3D SCREEN BREAST BILATERAL  Need for hepatitis C screening test -     Hepatitis C Antibody  Mild intermittent asthma without complication -     budesonide-formoterol (SYMBICORT) 80-4.5 MCG/ACT inhaler; Inhale 2 puffs into the lungs 2 (two) times daily.  Iron deficiency anemia, unspecified iron deficiency anemia type  Primary insomnia -     zolpidem (AMBIEN) 5 MG tablet; Take 1 tablet (5 mg total) by mouth at bedtime as needed for sleep.  Elevated blood pressure reading  Acute bilateral low back pain without sciatica -     celecoxib (CELEBREX) 200 MG capsule; Take 1 capsule (200 mg total) by mouth 2 (two) times daily.  Leg length discrepancy    Depression screen [facility] 2/9 12/15/2017 03/20/2017  Decreased  Interest 1 1  Down, Depressed, Hopeless 1 2  PHQ - 2 Score 2 3  Altered sleeping 2 3  Tired, decreased energy 1 2  Change in appetite 0 2  Feeling bad or failure about yourself  1 1  Trouble concentrating 0 1  Moving slowly or fidgety/restless 0 0  Suicidal thoughts 0 0  PHQ-9 Score 6 12  Difficult doing work/chores Somewhat difficult -  Some encounter information is confidential and restricted. Go to Review Flowsheets activity to see all data.   .. GAD 7 : Generalized Anxiety Score 12/15/2017  Nervous, Anxious, on Edge 1  Control/stop worrying 1  Worry too much - different things 1  Trouble relaxing 1  Restless 0  Easily annoyed or irritable 1  Afraid - awful might happen 0  Total GAD 7 Score 5  Anxiety Difficulty Somewhat difficult  Some encounter information is confidential and restricted. Go to Review Flowsheets activity to see all data.       BP is elevated today. Discussed with patient. Start keeping an eye on it at home. Decreased salt in diet. Try to start walking daily. Follow up in 1 month.   Start symbicort for asthma. Follow up in 1 month. Rinse mouth out.   Will make referral for chiropractor to work on leg discrepancy. Certainly could be causing some of her ongoing back pain. celebrex to start. Stop aleves.   Labs ordered to look at energy/prevention.  Per pt has had colonoscopy and bone density. Will call for reports.

## 2017-12-16 ENCOUNTER — Encounter: Payer: Self-pay | Admitting: Physician Assistant

## 2017-12-16 LAB — COMPLETE METABOLIC PANEL WITH GFR
AG Ratio: 1.5 (calc) (ref 1.0–2.5)
ALKALINE PHOSPHATASE (APISO): 107 U/L (ref 33–130)
ALT: 14 U/L (ref 6–29)
AST: 19 U/L (ref 10–35)
Albumin: 4.1 g/dL (ref 3.6–5.1)
BUN: 15 mg/dL (ref 7–25)
CALCIUM: 9.6 mg/dL (ref 8.6–10.4)
CO2: 27 mmol/L (ref 20–32)
CREATININE: 0.99 mg/dL (ref 0.50–0.99)
Chloride: 103 mmol/L (ref 98–110)
GFR, EST NON AFRICAN AMERICAN: 59 mL/min/{1.73_m2} — AB (ref >=60)
GFR, Est African American: 68 mL/min/{1.73_m2} (ref >=60)
GLUCOSE: 97 mg/dL (ref 65–99)
Globulin: 2.8 g/dL (calc) (ref 1.9–3.7)
Potassium: 4.4 mmol/L (ref 3.5–5.3)
SODIUM: 137 mmol/L (ref 135–146)
Total Bilirubin: 0.3 mg/dL (ref 0.2–1.2)
Total Protein: 6.9 g/dL (ref 6.1–8.1)

## 2017-12-16 LAB — TSH: TSH: 2.35 m[IU]/L (ref 0.40–4.50)

## 2017-12-16 LAB — HEPATITIS C ANTIBODY
Hepatitis C Ab: NONREACTIVE
SIGNAL TO CUT-OFF: 0.01 (ref <1.00)

## 2017-12-16 NOTE — Progress Notes (Signed)
Call pt: thyroid looks good.  Kidney, liver, glucose look good.

## 2017-12-18 ENCOUNTER — Encounter: Payer: Self-pay | Admitting: Physician Assistant

## 2017-12-18 DIAGNOSIS — L659 Nonscarring hair loss, unspecified: Secondary | ICD-10-CM | POA: Insufficient documentation

## 2017-12-18 DIAGNOSIS — M2170 Unequal limb length (acquired), unspecified site: Secondary | ICD-10-CM | POA: Insufficient documentation

## 2017-12-18 DIAGNOSIS — R030 Elevated blood-pressure reading, without diagnosis of hypertension: Secondary | ICD-10-CM | POA: Insufficient documentation

## 2017-12-18 DIAGNOSIS — M545 Low back pain, unspecified: Secondary | ICD-10-CM | POA: Insufficient documentation

## 2017-12-18 NOTE — Telephone Encounter (Signed)
Can we call and get bone density report from [facility] hospital imaging and colonoscopy digestive health?

## 2017-12-18 NOTE — Telephone Encounter (Signed)
Records requested

## 2017-12-23 ENCOUNTER — Encounter: Payer: Self-pay | Admitting: Physician Assistant

## 2017-12-23 MED ORDER — LISINOPRIL 5 MG PO TABS
5.0000 mg | ORAL_TABLET | Freq: Every day | ORAL | 1 refills | Status: DC
Start: 2017-12-23 — End: 2018-02-16

## 2017-12-25 ENCOUNTER — Telehealth: Payer: Self-pay | Admitting: Physician Assistant

## 2017-12-25 ENCOUNTER — Encounter: Payer: Self-pay | Admitting: Physician Assistant

## 2017-12-25 NOTE — Telephone Encounter (Signed)
Monica Page started me on Lisinopril and wanted to follow up on the blood pressure in a month. Can I wait until 8/12 or do I need to see her sooner?  ----- Message -----

## 2017-12-25 NOTE — Telephone Encounter (Signed)
Pt will need an appt one month from 12-15-17 to make sure the bp medication is working.  She needs this appt in addition to the August.

## 2017-12-25 NOTE — Telephone Encounter (Signed)
Appointment scheduled for 6/21

## 2018-01-04 ENCOUNTER — Encounter: Payer: Self-pay | Admitting: Physician Assistant

## 2018-01-15 ENCOUNTER — Ambulatory Visit: Payer: Self-pay | Admitting: Physician Assistant

## 2018-01-20 ENCOUNTER — Ambulatory Visit: Payer: Self-pay | Admitting: Physician Assistant

## 2018-01-22 ENCOUNTER — Encounter: Payer: Self-pay | Admitting: Physician Assistant

## 2018-01-22 ENCOUNTER — Ambulatory Visit (INDEPENDENT_AMBULATORY_CARE_PROVIDER_SITE_OTHER): Payer: Medicare Other | Admitting: Physician Assistant

## 2018-01-22 VITALS — BP 132/71 | HR 84 | Wt 182.0 lb

## 2018-01-22 DIAGNOSIS — H9392 Unspecified disorder of left ear: Secondary | ICD-10-CM

## 2018-01-22 DIAGNOSIS — L309 Dermatitis, unspecified: Secondary | ICD-10-CM | POA: Diagnosis not present

## 2018-01-22 DIAGNOSIS — I10 Essential (primary) hypertension: Secondary | ICD-10-CM | POA: Diagnosis not present

## 2018-01-22 DIAGNOSIS — J4520 Mild intermittent asthma, uncomplicated: Secondary | ICD-10-CM | POA: Diagnosis not present

## 2018-01-22 MED ORDER — TRIAMCINOLONE ACETONIDE 0.1 % EX CREA: 1.0000 "application " | TOPICAL_CREAM | Freq: Two times a day (BID) | CUTANEOUS | 1 refills | Status: AC | PRN

## 2018-01-22 NOTE — Progress Notes (Signed)
Subjective:    Patient ID: Monica Page, female    DOB: [birth year], 67 y.o.   MRN: [id number]  HPI Pt is a 67 yo female with pmhx of asthma, GERD, and hypertension who presents to clinic for medication follow-up.  She started on lisinopril after last visit and has been monitoring her BP at home. She reports some highs and lows but overall good response. She states she occasionally gets a headache or feels fatigued with the highs and lows.   Asthma - She was started on symbicort last visit as she had been using her rescue inhaler daily. She reports she no longer uses her rescue daily and has had decreased cough as well. She reports no adverse effects.  She also reports sensation of something crawling in her left ear for several days. She denies pain, hearing loss, tinnitus.  She has a lesion on her left ring finger. She states she had a bug bite one month ago that then became infected. She has had blistering at the site off and on as well.   Review of Systems  All other systems reviewed and are negative.  .. Active Ambulatory Problems    Diagnosis Date Noted  . Bladder cancer ([place]) 03/20/2017  . Hematuria 03/20/2017  . 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 Iron deficiency anemia 03/20/2017  . Post-menopausal 03/20/2017  . Elevated TSH 03/20/2017  . No energy 03/20/2017  . 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 Mild intermittent asthma without complication 46/27/0350  . Eczema 03/20/2017  . Gastroesophageal reflux disease 03/20/2017  . Normocytic anemia 03/22/2017  . Pain of left thumb 03/23/2017  . Degenerative arthritis of left knee 05/29/2017  . 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 Elevated fasting glucose 06/16/2017  . 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 Elevated liver enzymes 06/16/2017  . Serum albumin decreased 06/16/2017  . Hair loss 12/18/2017  . Leg length discrepancy 12/18/2017  . Acute bilateral low  back pain without sciatica 12/18/2017  . Elevated blood pressure reading 12/18/2017   Resolved Ambulatory Problems    Diagnosis Date Noted  . No Resolved Ambulatory Problems   Past Medical History:  Diagnosis Date  . Anemia   . Anxiety   . Arthritis   . Asthma   . Cancer ([place]) 07/28/2004  . Complication of anesthesia   . Depression   . GERD (gastroesophageal reflux disease)   . Sleep apnea    Family History  Problem Relation Age of Onset  . Alcohol abuse Mother   . Hyperlipidemia Mother   . Hypertension Mother   . Stroke Mother   . Depression Mother   . Aneurysm Mother   . Alcohol abuse Father   . Heart attack Father   . Cancer Father        lung and throat cancer  . Diabetes Maternal Grandmother   . Diabetes Maternal Grandfather        Objective:   Physical Exam  Constitutional: She is oriented to person, place, and time. She appears well-developed and well-nourished.  HENT:  Head: Normocephalic and atraumatic.  Left Ear: Tympanic membrane and external ear normal. No tenderness. No foreign bodies.  Nose: Nose normal.  Mouth/Throat: Oropharynx is clear and moist.  Some dry skin noted in left ear canal  Eyes: Conjunctivae are normal.  Cardiovascular: Normal rate and regular rhythm.  Pulmonary/Chest: Effort normal and breath sounds normal. She has no wheezes.  Neurological: She is alert and oriented  to person, place, and time.  Skin: Lesion noted.  Slightly erythematous red papule on left ring finger  Psychiatric: She has a normal mood and affect. Her behavior is normal.  Vitals reviewed.   . Vitals:   01/22/18 0956  BP: 132/71  Pulse: 84  SpO2: 100%       Assessment & Plan:  Marland KitchenMarland KitchenFae was seen today for hypertension.  Diagnoses and all orders for this visit:  Essential hypertension  Mild intermittent asthma without complication  Eczema, unspecified type -     triamcinolone cream (KENALOG) 0.1 %; Apply 1 application topically 2 (two) times daily  as needed (for eczema/psoriasis).  Ear problem, left     .Marland Kitchen Depression screen [facility] 2/9 01/22/2018 12/15/2017 03/20/2017  Decreased Interest 1 1 1   Down, Depressed, Hopeless 1 1 2   PHQ - 2 Score 2 2 3   Altered sleeping 1 2 3   Tired, decreased energy 1 1 2   Change in appetite 0 0 2  Feeling bad or failure about yourself  1 1 1   Trouble concentrating 0 0 1  Moving slowly or fidgety/restless 0 0 0  Suicidal thoughts 0 0 0  PHQ-9 Score 5 6 12   Difficult doing work/chores Somewhat difficult Somewhat difficult -  Some encounter information is confidential and restricted. Go to Review Flowsheets activity to see all data.   .. GAD 7 : Generalized Anxiety Score 01/22/2018 12/15/2017  Nervous, Anxious, on Edge 1 1  Control/stop worrying 1 1  Worry too much - different things 1 1  Trouble relaxing 1 1  Restless 0 0  Easily annoyed or irritable 1 1  Afraid - awful might happen 1 0  Total GAD 7 Score 6 5  Anxiety Difficulty Somewhat difficult Somewhat difficult  Some encounter information is confidential and restricted. Go to Review Flowsheets activity to see all data.     dermatitis - Reassurance given that it may be a mixture of contact and allergic dermatitis. Counseled patient to discontinue wearing her ring on that finger for 2 weeks. Tramcinolone cream prescribed. Patient instructed to call if lesion worsens.  HTN - Doing well. Continue current medications as prescribed.  Asthma - Well managed with the addition of symbicort. Continue current medications.  Ear - Reassurance given. Discussed used hydrogen peroxide to help with symptoms likely secondary to dry skin.  Follow up in 3 months.

## 2018-02-16 ENCOUNTER — Other Ambulatory Visit: Payer: Self-pay | Admitting: Physician Assistant

## 2018-03-03 ENCOUNTER — Ambulatory Visit: Payer: Medicare Other

## 2018-03-04 ENCOUNTER — Ambulatory Visit (INDEPENDENT_AMBULATORY_CARE_PROVIDER_SITE_OTHER): Payer: Medicare Other

## 2018-03-04 DIAGNOSIS — Z1231 Encounter for screening mammogram for malignant neoplasm of breast: Secondary | ICD-10-CM

## 2018-03-05 NOTE — Progress Notes (Signed)
Call pt: normal mammogram follow up in 1 year.

## 2018-03-08 ENCOUNTER — Ambulatory Visit: Payer: Self-pay | Admitting: Physician Assistant

## 2018-03-15 DIAGNOSIS — Z9889 Other specified postprocedural states: Secondary | ICD-10-CM | POA: Diagnosis not present

## 2018-03-15 DIAGNOSIS — H43811 Vitreous degeneration, right eye: Secondary | ICD-10-CM | POA: Diagnosis not present

## 2018-03-15 DIAGNOSIS — H18602 Keratoconus, unspecified, left eye: Secondary | ICD-10-CM | POA: Diagnosis not present

## 2018-03-15 DIAGNOSIS — H2511 Age-related nuclear cataract, right eye: Secondary | ICD-10-CM | POA: Diagnosis not present

## 2018-03-15 DIAGNOSIS — Z961 Presence of intraocular lens: Secondary | ICD-10-CM | POA: Diagnosis not present

## 2018-03-21 ENCOUNTER — Encounter: Payer: Self-pay | Admitting: Physician Assistant

## 2018-03-21 ENCOUNTER — Other Ambulatory Visit: Payer: Self-pay | Admitting: Physician Assistant

## 2018-03-21 DIAGNOSIS — M545 Low back pain, unspecified: Secondary | ICD-10-CM

## 2018-03-21 DIAGNOSIS — F5101 Primary insomnia: Secondary | ICD-10-CM

## 2018-03-22 ENCOUNTER — Other Ambulatory Visit: Payer: Self-pay | Admitting: Physician Assistant

## 2018-03-22 DIAGNOSIS — Z961 Presence of intraocular lens: Secondary | ICD-10-CM | POA: Diagnosis not present

## 2018-03-22 DIAGNOSIS — M545 Low back pain, unspecified: Secondary | ICD-10-CM

## 2018-03-22 DIAGNOSIS — H2511 Age-related nuclear cataract, right eye: Secondary | ICD-10-CM | POA: Diagnosis not present

## 2018-03-22 DIAGNOSIS — H43811 Vitreous degeneration, right eye: Secondary | ICD-10-CM | POA: Diagnosis not present

## 2018-03-22 DIAGNOSIS — H18602 Keratoconus, unspecified, left eye: Secondary | ICD-10-CM | POA: Diagnosis not present

## 2018-03-22 DIAGNOSIS — Z9889 Other specified postprocedural states: Secondary | ICD-10-CM | POA: Diagnosis not present

## 2018-03-22 MED ORDER — ZOLPIDEM TARTRATE 5 MG PO TABS: 5.0000 mg | ORAL_TABLET | Freq: Every evening | ORAL | 1 refills | Status: DC | PRN

## 2018-03-23 MED ORDER — CELECOXIB 200 MG PO CAPS: 200.0000 mg | ORAL_CAPSULE | Freq: Two times a day (BID) | ORAL | 1 refills | Status: DC

## 2018-04-01 ENCOUNTER — Encounter: Payer: Self-pay | Admitting: Physician Assistant

## 2018-04-26 ENCOUNTER — Ambulatory Visit: Payer: Self-pay | Admitting: Physician Assistant

## 2018-04-27 ENCOUNTER — Ambulatory Visit: Payer: Self-pay | Admitting: Physician Assistant

## 2018-04-30 ENCOUNTER — Ambulatory Visit (INDEPENDENT_AMBULATORY_CARE_PROVIDER_SITE_OTHER): Payer: Medicare Other | Admitting: Physician Assistant

## 2018-04-30 ENCOUNTER — Encounter: Payer: Self-pay | Admitting: Physician Assistant

## 2018-04-30 VITALS — BP 124/78 | HR 102 | Wt 165.0 lb

## 2018-04-30 DIAGNOSIS — Z13220 Encounter for screening for lipoid disorders: Secondary | ICD-10-CM

## 2018-04-30 DIAGNOSIS — R7989 Other specified abnormal findings of blood chemistry: Secondary | ICD-10-CM | POA: Diagnosis not present

## 2018-04-30 DIAGNOSIS — K219 Gastro-esophageal reflux disease without esophagitis: Secondary | ICD-10-CM | POA: Diagnosis not present

## 2018-04-30 DIAGNOSIS — G4730 Sleep apnea, unspecified: Secondary | ICD-10-CM | POA: Diagnosis not present

## 2018-04-30 DIAGNOSIS — Z131 Encounter for screening for diabetes mellitus: Secondary | ICD-10-CM

## 2018-04-30 DIAGNOSIS — Z23 Encounter for immunization: Secondary | ICD-10-CM | POA: Diagnosis not present

## 2018-04-30 DIAGNOSIS — G4733 Obstructive sleep apnea (adult) (pediatric): Secondary | ICD-10-CM

## 2018-04-30 DIAGNOSIS — F321 Major depressive disorder, single episode, moderate: Secondary | ICD-10-CM | POA: Diagnosis not present

## 2018-04-30 DIAGNOSIS — R413 Other amnesia: Secondary | ICD-10-CM | POA: Diagnosis not present

## 2018-04-30 DIAGNOSIS — E7800 Pure hypercholesterolemia, unspecified: Secondary | ICD-10-CM | POA: Diagnosis not present

## 2018-04-30 MED ORDER — BUPROPION HCL ER (XL) 300 MG PO TB24: 300.0000 mg | ORAL_TABLET | Freq: Every day | ORAL | 0 refills | Status: DC

## 2018-04-30 MED ORDER — FAMOTIDINE 20 MG PO TABS: 20.0000 mg | ORAL_TABLET | Freq: Two times a day (BID) | ORAL | 3 refills | Status: DC

## 2018-04-30 MED ORDER — AMBULATORY NON FORMULARY MEDICATION: 0 refills | Status: DC

## 2018-04-30 NOTE — Progress Notes (Signed)
Subjective:    Patient ID: Monica Page, female    DOB: 07-Jun-1951, 67 y.o.   MRN: [id number]  HPI Pt is a 67 yo female with HTN, OSA, depression, GERD, asthma who presents to the clinic for follow up.   Pt questions what to do since zantac was recalled.   Asthma is well controlled but she feels like symbicort causing her to feel jittery.   She request rx to have new CPAP mask. She feels like it is leaking.   Her depression is really bad. No SI/HC. She has no motivation. She cries a lot. She wonders what the next step for medication is. She does feel like wellbutrin is helping some.   She is concerned with her memory. She forgot her keys the other day. She forgot how to get somewhere she always go a few days ago.  .. Active Ambulatory Problems    Diagnosis Date Noted  . Bladder cancer ([place]) 03/20/2017  . Hematuria 03/20/2017  . 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 Iron deficiency anemia 03/20/2017  . Post-menopausal 03/20/2017  . Elevated TSH 03/20/2017  . No energy 03/20/2017  . 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 Mild intermittent asthma without complication 75/04/2584  . Eczema 03/20/2017  . Gastroesophageal reflux disease 03/20/2017  . Normocytic anemia 03/22/2017  . Pain of left thumb 03/23/2017  . Degenerative arthritis of left knee 05/29/2017  . 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 Elevated fasting glucose 06/16/2017  . 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 Elevated liver enzymes 06/16/2017  . Serum albumin decreased 06/16/2017  . Hair loss 12/18/2017  . Leg length discrepancy 12/18/2017  . Acute bilateral low back pain without sciatica 12/18/2017  . Elevated blood pressure reading 12/18/2017  . Essential hypertension 01/22/2018  . Sleep apnea 05/03/2018   Resolved Ambulatory Problems    Diagnosis Date Noted  . No Resolved Ambulatory Problems   Past Medical History:  Diagnosis Date  . Anemia    . Anxiety   . Arthritis   . Asthma   . Cancer ([place]) 07/28/2004  . Complication of anesthesia   . Depression   . GERD (gastroesophageal reflux disease)      Review of Systems  All other systems reviewed and are negative.      Objective:   Physical Exam  Constitutional: She appears well-developed and well-nourished.  HENT:  Head: Normocephalic and atraumatic.  Cardiovascular: Normal rate and regular rhythm.  Pulmonary/Chest: Effort normal.  Skin: No rash noted.  Psychiatric:  Tearful.           Assessment & Plan:  Marland KitchenMarland KitchenCricket was seen today for follow-up.  Diagnoses and all orders for this visit:  Memory changes -     RPR -     Vitamin B12 -     Sedimentation rate -     CBC -     TSH -     Folate  OSA (obstructive sleep apnea)  Screening for diabetes mellitus  Screening for lipid disorders -     Lipid Panel w/reflex Direct LDL  Gastroesophageal reflux disease, esophagitis presence not specified -     famotidine (PEPCID) 20 MG tablet; Take 1 tablet (20 mg total) by mouth 2 (two) times daily.  Need for influenza vaccination -     Flu Vaccine QUAD 6+ mos PF IM (Fluarix Quad PF)  Moderate major depression (HCC) -     buPROPion (WELLBUTRIN XL) 300 MG 24 hr  tablet; Take 1 tablet (300 mg total) by mouth daily.  Sleep apnea, unspecified type -     AMBULATORY NON FORMULARY MEDICATION; Mask for CPAP machine that goes over nose and mouth.  .. 6CIT Screen 05/03/2018  What Year? 0 points  What month? 0 points  What time? 0 points  Count back from 20 0 points  Months in reverse 0 points  Repeat phrase 0 points  Total Score 0      Increased wellbutrin to 300mg  daily. To add to cymbalta. I believe that depression could be causing memory issues. 6CIT was perfect. Will get some labs. Continue to follow. I think patient would be a great candidate for [place]. Will make referral.   Stop zantac and switch to pepcid.   rx for mask printed to give to CPAP company.

## 2018-04-30 NOTE — Patient Instructions (Addendum)
Switch zantac to pepcid.  Decreased symbicort 1 puff twice a day.  Increase wellbutrin 300mg XL  Consider transmagnetic stimulation. Will make a referral.      Memory Compensation Strategies  1. Use "WARM" strategy.  W= write it down  A= associate it  R= repeat it  M= make a mental note  2.   You can keep a Social worker.  Use a 3-ring notebook with sections for the following: calendar, important names and phone numbers,  medications, doctors' names/phone numbers, lists/reminders, and a section to journal what you did  each day.   3.    Use a calendar to write appointments down.  4.    Write yourself a schedule for the day.  This can be placed on the calendar or in a separate section of the Memory Notebook.  Keeping a  regular schedule can help memory.  5.    Use medication organizer with sections for each day or morning/evening pills.  You may need help loading it  6.    Keep a basket, or pegboard by the door.  Place items that you need to take out with you in the basket or on the pegboard.  You may also want to  include a message board for reminders.  7.    Use sticky notes.  Place sticky notes with reminders in a place where the task is performed.  For example: " turn off the  stove" placed by the stove, "lock the door" placed on the door at eye level, " take your medications" on  the bathroom mirror or by the place where you normally take your medications.  8.    Use alarms/timers.  Use while cooking to remind yourself to check on food or as a reminder to take your medicine, or as a  reminder to make a call, or as a reminder to perform another task, etc.

## 2018-05-03 ENCOUNTER — Encounter: Payer: Self-pay | Admitting: Physician Assistant

## 2018-05-03 DIAGNOSIS — G4730 Sleep apnea, unspecified: Secondary | ICD-10-CM | POA: Insufficient documentation

## 2018-05-03 LAB — CBC
HEMATOCRIT: 39.9 % (ref 35.0–45.0)
Hemoglobin: 13.5 g/dL (ref 11.7–15.5)
MCH: 29.2 pg (ref 27.0–33.0)
MCHC: 33.8 g/dL (ref 32.0–36.0)
MCV: 86.2 fL (ref 80.0–100.0)
MPV: 9.7 fL (ref 7.5–12.5)
Platelets: 400 10*3/uL (ref 140–400)
RBC: 4.63 10*6/uL (ref 3.80–5.10)
RDW: 14.3 % (ref 11.0–15.0)
WBC: 4.1 10*3/uL (ref 3.8–10.8)

## 2018-05-03 LAB — LIPID PANEL W/REFLEX DIRECT LDL
CHOL/HDL RATIO: 3.3 (calc) (ref <5.0)
Cholesterol: 193 mg/dL (ref <200)
HDL: 59 mg/dL (ref >50)
LDL Cholesterol (Calc): 112 mg/dL (calc) — ABNORMAL HIGH
NON-HDL CHOLESTEROL (CALC): 134 mg/dL — AB (ref <130)
Triglycerides: 110 mg/dL (ref <150)

## 2018-05-03 LAB — FOLATE: Folate: >24 ng/mL

## 2018-05-03 LAB — TSH: TSH: 1.71 mIU/L (ref 0.40–4.50)

## 2018-05-03 LAB — SEDIMENTATION RATE: Sed Rate: 19 mm/h (ref 0–30)

## 2018-05-03 LAB — VITAMIN B12: VITAMIN B 12: 394 pg/mL (ref 200–1100)

## 2018-05-03 LAB — RPR: RPR: NONREACTIVE

## 2018-05-03 NOTE — Progress Notes (Signed)
Call pt: b12 normal range. No anemia. hgb looks great! Thyroid and folate looks great.

## 2018-05-03 NOTE — Progress Notes (Signed)
Call  Pt: cholesterol is ok but up from previous yearts.

## 2018-05-13 DIAGNOSIS — Z79899 Other long term (current) drug therapy: Secondary | ICD-10-CM | POA: Diagnosis not present

## 2018-05-13 DIAGNOSIS — Z9889 Other specified postprocedural states: Secondary | ICD-10-CM | POA: Diagnosis not present

## 2018-05-13 DIAGNOSIS — F329 Major depressive disorder, single episode, unspecified: Secondary | ICD-10-CM | POA: Diagnosis not present

## 2018-05-13 DIAGNOSIS — K219 Gastro-esophageal reflux disease without esophagitis: Secondary | ICD-10-CM | POA: Diagnosis not present

## 2018-05-13 DIAGNOSIS — Z9842 Cataract extraction status, left eye: Secondary | ICD-10-CM | POA: Diagnosis not present

## 2018-05-13 DIAGNOSIS — S0501XA Injury of conjunctiva and corneal abrasion without foreign body, right eye, initial encounter: Secondary | ICD-10-CM | POA: Diagnosis not present

## 2018-05-13 DIAGNOSIS — H18602 Keratoconus, unspecified, left eye: Secondary | ICD-10-CM | POA: Diagnosis not present

## 2018-05-13 DIAGNOSIS — Z880 Allergy status to penicillin: Secondary | ICD-10-CM | POA: Diagnosis not present

## 2018-05-13 DIAGNOSIS — G8911 Acute pain due to trauma: Secondary | ICD-10-CM | POA: Diagnosis not present

## 2018-05-13 DIAGNOSIS — H04129 Dry eye syndrome of unspecified lacrimal gland: Secondary | ICD-10-CM | POA: Diagnosis not present

## 2018-05-13 DIAGNOSIS — H43811 Vitreous degeneration, right eye: Secondary | ICD-10-CM | POA: Diagnosis not present

## 2018-05-13 DIAGNOSIS — H2511 Age-related nuclear cataract, right eye: Secondary | ICD-10-CM | POA: Diagnosis not present

## 2018-05-13 DIAGNOSIS — Z961 Presence of intraocular lens: Secondary | ICD-10-CM | POA: Diagnosis not present

## 2018-05-13 DIAGNOSIS — Z881 Allergy status to other antibiotic agents status: Secondary | ICD-10-CM | POA: Diagnosis not present

## 2018-05-13 DIAGNOSIS — Z888 Allergy status to other drugs, medicaments and biological substances status: Secondary | ICD-10-CM | POA: Diagnosis not present

## 2018-05-19 DIAGNOSIS — F332 Major depressive disorder, recurrent severe without psychotic features: Secondary | ICD-10-CM | POA: Diagnosis not present

## 2018-05-31 DIAGNOSIS — F332 Major depressive disorder, recurrent severe without psychotic features: Secondary | ICD-10-CM | POA: Diagnosis not present

## 2018-06-01 DIAGNOSIS — F332 Major depressive disorder, recurrent severe without psychotic features: Secondary | ICD-10-CM | POA: Diagnosis not present

## 2018-06-01 DIAGNOSIS — E7800 Pure hypercholesterolemia, unspecified: Secondary | ICD-10-CM | POA: Insufficient documentation

## 2018-06-02 DIAGNOSIS — F332 Major depressive disorder, recurrent severe without psychotic features: Secondary | ICD-10-CM | POA: Diagnosis not present

## 2018-06-03 DIAGNOSIS — F332 Major depressive disorder, recurrent severe without psychotic features: Secondary | ICD-10-CM | POA: Diagnosis not present

## 2018-06-04 DIAGNOSIS — F332 Major depressive disorder, recurrent severe without psychotic features: Secondary | ICD-10-CM | POA: Diagnosis not present

## 2018-06-08 DIAGNOSIS — F332 Major depressive disorder, recurrent severe without psychotic features: Secondary | ICD-10-CM | POA: Diagnosis not present

## 2018-06-09 DIAGNOSIS — F332 Major depressive disorder, recurrent severe without psychotic features: Secondary | ICD-10-CM | POA: Diagnosis not present

## 2018-06-11 ENCOUNTER — Other Ambulatory Visit: Payer: Self-pay | Admitting: Physician Assistant

## 2018-06-11 DIAGNOSIS — F332 Major depressive disorder, recurrent severe without psychotic features: Secondary | ICD-10-CM | POA: Diagnosis not present

## 2018-06-11 DIAGNOSIS — F321 Major depressive disorder, single episode, moderate: Secondary | ICD-10-CM

## 2018-06-22 DIAGNOSIS — F332 Major depressive disorder, recurrent severe without psychotic features: Secondary | ICD-10-CM | POA: Diagnosis not present

## 2018-06-23 DIAGNOSIS — F332 Major depressive disorder, recurrent severe without psychotic features: Secondary | ICD-10-CM | POA: Diagnosis not present

## 2018-06-25 DIAGNOSIS — F332 Major depressive disorder, recurrent severe without psychotic features: Secondary | ICD-10-CM | POA: Diagnosis not present

## 2018-07-12 ENCOUNTER — Ambulatory Visit: Payer: Self-pay | Admitting: Physician Assistant

## 2018-07-27 ENCOUNTER — Ambulatory Visit: Payer: 59 | Admitting: Physician Assistant

## 2018-07-28 ENCOUNTER — Other Ambulatory Visit: Payer: Self-pay | Admitting: Physician Assistant

## 2018-07-28 DIAGNOSIS — F321 Major depressive disorder, single episode, moderate: Secondary | ICD-10-CM

## 2018-08-05 ENCOUNTER — Other Ambulatory Visit: Payer: Self-pay | Admitting: Physician Assistant

## 2018-08-09 ENCOUNTER — Other Ambulatory Visit: Payer: Self-pay | Admitting: Physician Assistant

## 2018-08-09 ENCOUNTER — Ambulatory Visit: Payer: Self-pay | Admitting: Physician Assistant

## 2018-08-13 ENCOUNTER — Other Ambulatory Visit: Payer: Self-pay | Admitting: Physician Assistant

## 2018-08-19 ENCOUNTER — Ambulatory Visit: Payer: Self-pay | Admitting: Physician Assistant

## 2018-08-21 ENCOUNTER — Other Ambulatory Visit: Payer: Self-pay | Admitting: Physician Assistant

## 2018-08-21 DIAGNOSIS — F321 Major depressive disorder, single episode, moderate: Secondary | ICD-10-CM

## 2018-08-21 DIAGNOSIS — M545 Low back pain, unspecified: Secondary | ICD-10-CM

## 2018-08-23 ENCOUNTER — Ambulatory Visit (INDEPENDENT_AMBULATORY_CARE_PROVIDER_SITE_OTHER): Payer: Medicare Other | Admitting: Physician Assistant

## 2018-08-23 ENCOUNTER — Encounter: Payer: Self-pay | Admitting: Physician Assistant

## 2018-08-23 VITALS — BP 114/66 | HR 104 | Temp 97.4°F | Wt 158.5 lb

## 2018-08-23 DIAGNOSIS — F321 Major depressive disorder, single episode, moderate: Secondary | ICD-10-CM

## 2018-08-23 DIAGNOSIS — F5101 Primary insomnia: Secondary | ICD-10-CM | POA: Diagnosis not present

## 2018-08-23 DIAGNOSIS — M545 Low back pain, unspecified: Secondary | ICD-10-CM

## 2018-08-23 DIAGNOSIS — I10 Essential (primary) hypertension: Secondary | ICD-10-CM

## 2018-08-23 DIAGNOSIS — K219 Gastro-esophageal reflux disease without esophagitis: Secondary | ICD-10-CM

## 2018-08-23 MED ORDER — PANTOPRAZOLE SODIUM 40 MG PO TBEC: 40.0000 mg | DELAYED_RELEASE_TABLET | Freq: Every day | ORAL | 3 refills | Status: DC

## 2018-08-23 MED ORDER — LISINOPRIL 5 MG PO TABS: 5.0000 mg | ORAL_TABLET | Freq: Every day | ORAL | 1 refills | Status: DC

## 2018-08-23 MED ORDER — ZOLPIDEM TARTRATE 5 MG PO TABS: 5.0000 mg | ORAL_TABLET | Freq: Every evening | ORAL | 1 refills | Status: DC | PRN

## 2018-08-23 MED ORDER — CELECOXIB 200 MG PO CAPS: 200.0000 mg | ORAL_CAPSULE | Freq: Two times a day (BID) | ORAL | 1 refills | Status: DC

## 2018-08-23 MED ORDER — BUPROPION HCL ER (XL) 300 MG PO TB24: 300.0000 mg | ORAL_TABLET | Freq: Every day | ORAL | 1 refills | Status: DC

## 2018-08-23 MED ORDER — DULOXETINE HCL 60 MG PO CPEP: 60.0000 mg | ORAL_CAPSULE | Freq: Two times a day (BID) | ORAL | 1 refills | Status: DC

## 2018-08-24 ENCOUNTER — Encounter: Payer: Self-pay | Admitting: Physician Assistant

## 2018-08-24 DIAGNOSIS — J302 Other seasonal allergic rhinitis: Secondary | ICD-10-CM

## 2018-08-24 DIAGNOSIS — J4520 Mild intermittent asthma, uncomplicated: Secondary | ICD-10-CM

## 2018-08-24 MED ORDER — MONTELUKAST SODIUM 10 MG PO TABS: 10.0000 mg | ORAL_TABLET | Freq: Every day | ORAL | 3 refills | Status: DC

## 2018-08-24 NOTE — Progress Notes (Signed)
Subjective:    Patient ID: Monica Page, female    DOB: 21-Nov-1950, 68 y.o.   MRN: [id number]  HPI Pt is a 68 yo female with MDD, HTN, insomnia, GERD who presents to the clinic for medication refills.   Overall she is doing good. Her mood is better than last visit. She is making some changes in her life and eliminating some of her stressors. She went to a few sessions of [place] and felt like it was helping but unable to afford remaining sessions. She continues on her medication and feels like it is a good fit for her now. No SI/HC.   She is sleeping good with no complaints.   .. Active Ambulatory Problems    Diagnosis Date Noted  . Bladder cancer ([place]) 03/20/2017  . Hematuria 03/20/2017  . 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 Iron deficiency anemia 03/20/2017  . Post-menopausal 03/20/2017  . Elevated TSH 03/20/2017  . No energy 03/20/2017  . 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 Mild intermittent asthma without complication 43/15/4008  . Eczema 03/20/2017  . Gastroesophageal reflux disease 03/20/2017  . Normocytic anemia 03/22/2017  . Pain of left thumb 03/23/2017  . Degenerative arthritis of left knee 05/29/2017  . 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 Elevated fasting glucose 06/16/2017  . 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 Elevated liver enzymes 06/16/2017  . Serum albumin decreased 06/16/2017  . Hair loss 12/18/2017  . Leg length discrepancy 12/18/2017  . Acute bilateral low back pain without sciatica 12/18/2017  . Elevated blood pressure reading 12/18/2017  . Essential hypertension 01/22/2018  . Sleep apnea 05/03/2018  . Elevated LDL cholesterol level 06/01/2018  . History of bladder cancer 09/24/2011  . History of Barrett's esophagus 10/07/2014  . Hemorrhoids, internal 03/30/2015  . Gastroesophageal reflux disease with esophagitis 01/17/2015  . 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 Dry eye syndrome  11/02/2013  . 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 Nuclear sclerotic cataract of right eye 11/02/2013  . Pseudophakia, left eye 11/02/2013  . Rosacea 04/07/2012   Resolved Ambulatory Problems    Diagnosis Date Noted  . No Resolved Ambulatory Problems   Past Medical History:  Diagnosis Date  . Anemia   . Anxiety   . Arthritis   . Asthma   . Cancer ([place]) 07/28/2004  . Complication of anesthesia   . Depression   . GERD (gastroesophageal reflux disease)       Review of Systems  All other systems reviewed and are negative.      Objective:   Physical Exam Vitals signs reviewed.  Constitutional:      Appearance: Normal appearance.  HENT:     Head: Normocephalic and atraumatic.  Cardiovascular:     Rate and Rhythm: Normal rate and regular rhythm.     Pulses: Normal pulses.     Heart sounds: Normal heart sounds.  Pulmonary:     Effort: Pulmonary effort is normal.     Breath sounds: Normal breath sounds.  Neurological:     General: No focal deficit present.     Mental Status: She is alert and oriented to person, place, and time.  Psychiatric:        Mood and Affect: Mood normal.        Behavior: Behavior normal.           Assessment & Plan:  Marland KitchenMarland KitchenGesenia was seen today for follow-up.  Diagnoses  and all orders for this visit:  Essential hypertension -     lisinopril (PRINIVIL,ZESTRIL) 5 MG tablet; Take 1 tablet (5 mg total) by mouth daily.  Acute bilateral low back pain without sciatica -     celecoxib (CELEBREX) 200 MG capsule; Take 1 capsule (200 mg total) by mouth 2 (two) times daily.  Moderate major depression (HCC) -     DULoxetine (CYMBALTA) 60 MG capsule; Take 1 capsule (60 mg total) by mouth 2 (two) times daily. -     buPROPion (WELLBUTRIN XL) 300 MG 24 hr tablet; Take 1 tablet (300 mg total) by mouth daily.  Primary insomnia -     zolpidem (AMBIEN) 5 MG tablet; Take 1 tablet (5 mg total) by mouth at bedtime  as needed for sleep.  Gastroesophageal reflux disease, esophagitis presence not specified -     pantoprazole (PROTONIX) 40 MG tablet; Take 1 tablet (40 mg total) by mouth daily.   .. Depression screen [facility] 2/9 08/24/2018 08/23/2018 04/30/2018 01/22/2018 12/15/2017  Decreased Interest 2 3 0 1 1  Down, Depressed, Hopeless 2 2 0 1 1  PHQ - 2 Score 4 5 0 2 2  Altered sleeping 1 1 - 1 2  Tired, decreased energy 1 1 - 1 1  Change in appetite 1 1 - 0 0  Feeling bad or failure about yourself  1 3 - 1 1  Trouble concentrating 0 1 - 0 0  Moving slowly or fidgety/restless 0 0 - 0 0  Suicidal thoughts 0 0 - 0 0  PHQ-9 Score 8 12 - 5 6  Difficult doing work/chores Somewhat difficult Very difficult - Somewhat difficult Somewhat difficult  Some encounter information is confidential and restricted. Go to Review Flowsheets activity to see all data.   .. GAD 7 : Generalized Anxiety Score 08/24/2018 08/23/2018 01/22/2018 12/15/2017  Nervous, Anxious, on Edge 2 2 1 1   Control/stop worrying 3 3 1 1   Worry too much - different things 3 3 1 1   Trouble relaxing 2 2 1 1   Restless 2 2 0 0  Easily annoyed or irritable 3 3 1 1   Afraid - awful might happen 3 3 1  0  Total GAD 7 Score 18 18 6 5   Anxiety Difficulty Very difficult Very difficult Somewhat difficult Somewhat difficult  Some encounter information is confidential and restricted. Go to Review Flowsheets activity to see all data.    She does not wish to change any medications. She reports they are helping and she is better. TMS was helping but unable to continue treatments due to cost of gas and travel.   Medications refilled for 6 months.  Labs up to date.  Needs shingrix. Declined today.

## 2018-08-27 ENCOUNTER — Other Ambulatory Visit: Payer: Self-pay | Admitting: Physician Assistant

## 2018-08-27 DIAGNOSIS — F321 Major depressive disorder, single episode, moderate: Secondary | ICD-10-CM

## 2018-11-09 ENCOUNTER — Other Ambulatory Visit: Payer: Self-pay | Admitting: Physician Assistant

## 2018-11-09 DIAGNOSIS — I10 Essential (primary) hypertension: Secondary | ICD-10-CM

## 2018-11-23 ENCOUNTER — Ambulatory Visit (INDEPENDENT_AMBULATORY_CARE_PROVIDER_SITE_OTHER): Payer: Medicare Other

## 2018-11-23 ENCOUNTER — Other Ambulatory Visit: Payer: Self-pay

## 2018-11-23 ENCOUNTER — Ambulatory Visit (INDEPENDENT_AMBULATORY_CARE_PROVIDER_SITE_OTHER): Payer: Medicare Other | Admitting: Sports Medicine

## 2018-11-23 ENCOUNTER — Encounter: Payer: Self-pay | Admitting: Sports Medicine

## 2018-11-23 DIAGNOSIS — M25552 Pain in left hip: Secondary | ICD-10-CM | POA: Diagnosis not present

## 2018-11-23 DIAGNOSIS — M1612 Unilateral primary osteoarthritis, left hip: Secondary | ICD-10-CM | POA: Diagnosis not present

## 2018-11-23 MED ORDER — HYDROCODONE-ACETAMINOPHEN 5-325 MG PO TABS: 1.0000 | ORAL_TABLET | Freq: Three times a day (TID) | ORAL | 0 refills | Status: DC | PRN

## 2018-11-23 NOTE — Assessment & Plan Note (Signed)
Occurred after the fall. X-rays, she needs to ambulate with a cane. Hydrocodone for pain, rehab exercises given, I was able to reproduce her pain with resisted hip flexion. Return to see me in a month, injection versus MRI if no better, she does have some symptoms suspicious for labral tear.

## 2018-11-23 NOTE — Progress Notes (Signed)
Subjective:    CC: Left hip pain  HPI: This is a pleasant 68 year old female, 2 weeks ago she had a fall, injured her right hip.  She had some difficulty walking, but tried to treat herself at home.  Pain is moderate, persistent, localized in the left groin with radiation down to the anterior thigh.  No bowel or bladder dysfunction, back pain, saddle numbness, constitutional symptoms.  I reviewed the past medical history, family history, social history, surgical history, and allergies today and no changes were needed.  Please see the problem list section below in epic for further details.  Past Medical History: Past Medical History:  Diagnosis Date  . Anemia    hx. of anemia  . Anxiety   . Arthritis   . Asthma   . 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 Complication of anesthesia    asthma attack coming out of anesthesia  . Depression   . GERD (gastroesophageal reflux disease)   . Sleep apnea    CPAP does not use   Past Surgical History: Past Surgical History:  Procedure Laterality Date  . broken elbow Right   . brooken toe Right   . EYE SURGERY    . infected knee relpacement Right 2017  . JOINT REPLACEMENT Right 07/29/2015   knee  . 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 TOTAL KNEE ARTHROPLASTY Left 06/01/2017   Procedure: TOTAL KNEE ARTHROPLASTY;  Surgeon: Frederik Pear, MD;  Location: [facility];  Service: Orthopedics;  Laterality: Left;  . TUBAL LIGATION     Social History: Social History   Socioeconomic History  . Marital status: Divorced    Spouse name: Not on file  . Number of children: Not on file  . Years of education: Not on file  . Highest education level: Not on file  Occupational History  . Not on file  Social Needs  . Financial resource strain: Not on file  . Food insecurity:    Worry: Not on file    Inability: Not on file  . Transportation needs:    Medical: Not on file    Non-medical: Not on file  Tobacco Use  . Smoking status: Never Smoker   . Smokeless tobacco: Never Used  Substance and Sexual Activity  . Alcohol use: Yes    Alcohol/week: 1.0 standard drinks    Types: 1 Glasses of wine per week    Comment: rare  . Drug use: No  . Sexual activity: Never    Birth control/protection: None  Lifestyle  . Physical activity:    Days per week: Not on file    Minutes per session: Not on file  . Stress: Not on file  Relationships  . Social connections:    Talks on phone: Not on file    Gets together: Not on file    Attends religious service: Not on file    Active member of club or organization: Not on file    Attends meetings of clubs or organizations: Not on file    Relationship status: Not on file  Other Topics Concern  . Not on file  Social History Narrative  . Not on file   Family History: Family History  Problem Relation Age of Onset  . Alcohol abuse Mother   . Hyperlipidemia Mother   . Hypertension Mother   . Stroke Mother   . Depression Mother   . Aneurysm Mother   . Alcohol abuse Father   . Heart attack Father   .  Cancer Father        lung and throat cancer  . Diabetes Maternal Grandmother   . Diabetes Maternal Grandfather    Allergies: Allergies  Allergen Reactions  . Azithromycin     UNSPECIFIED REACTION   . Doxycycline     UNSPECIFIED REACTION   . Levofloxacin     UNSPECIFIED REACTION   . Penicillins Rash   Medications: See med rec.  Review of Systems: No fevers, chills, night sweats, weight loss, chest pain, or shortness of breath.   Objective:    General: Well Developed, well nourished, and in no acute distress.  Neuro: Alert and oriented x3, extra-ocular muscles intact, sensation grossly intact.  HEENT: Normocephalic, atraumatic, pupils equal round reactive to light, neck supple, no masses, no lymphadenopathy, thyroid nonpalpable.  Skin: Warm and dry, no rashes. Cardiac: Regular rate and rhythm, no murmurs rubs or gallops, no lower extremity edema.  Respiratory: Clear to auscultation  bilaterally. Not using accessory muscles, speaking in full sentences. Left hip: ROM IR: 45 deg with mild pain, ER: 60 Deg, Flexion: 120 Deg, Extension: 100 Deg, Abduction: 45 Deg, Adduction: 45 Deg Strength IR: 5/5, ER: 5/5, Flexion: 4/5 with reproduction of pain, Extension: 5/5, Abduction: 5/5, Adduction: 5/5 Pelvic alignment unremarkable to inspection and palpation. Standing hip rotation and gait without trendelenburg / unsteadiness. Greater trochanter without tenderness to palpation. No tenderness over piriformis. No SI joint tenderness and normal minimal SI movement.  Impression and Recommendations:    Left hip pain Occurred after the fall. X-rays, she needs to ambulate with a cane. Hydrocodone for pain, rehab exercises given, I was able to reproduce her pain with resisted hip flexion. Return to see me in a month, injection versus MRI if no better, she does have some symptoms suspicious for labral tear.   ___________________________________________ Gwen Her. Dianah Field, M.D., ABFM., CAQSM. Primary Care and Sports Medicine [facility]  Adjunct Professor of [employer]

## 2019-01-20 ENCOUNTER — Other Ambulatory Visit: Payer: Self-pay | Admitting: Physician Assistant

## 2019-01-20 DIAGNOSIS — Z1231 Encounter for screening mammogram for malignant neoplasm of breast: Secondary | ICD-10-CM

## 2019-01-26 NOTE — Progress Notes (Deleted)
Subjective:   Monica Page is a 68 y.o. female who presents for Medicare Annual (Subsequent) preventive examination.  Review of Systems:  No ROS.  Medicare Wellness Virtual Visit.  Visual/audio telehealth visit, UTA vital signs.   See social history for additional risk factors.      Sleep patterns:  Home Safety/Smoke Alarms: Feels safe in home. Smoke alarms in place.  Living environment; Seat Belt Safety/Bike Helmet: Wears seat belt.   Female:   Pap-  Aged out    Mammo-  scheduled     Dexa scan-        CCS-  UTD      Objective:     Vitals: LMP 07/28/2010   There is no height or weight on file to calculate BMI.  Advanced Directives 06/12/2017 06/01/2017 05/21/2017  Does Patient Have a Medical Advance Directive? Yes Yes No  Type of Advance Directive Healthcare Power of [place] -  Does patient want to make changes to medical advance directive? No - Patient declined No - Patient declined No - Patient declined  Copy of [place] in Chart? No - copy requested No - copy requested -    Tobacco Social History   Tobacco Use  Smoking Status Never Smoker  Smokeless Tobacco Never Used     Counseling given: Not Answered   Clinical Intake:                       Past Medical History:  Diagnosis Date  . Anemia    hx. of anemia  . Anxiety   . Arthritis   . Asthma   . 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 Complication of anesthesia    asthma attack coming out of anesthesia  . Depression   . GERD (gastroesophageal reflux disease)   . Sleep apnea    CPAP does not use   Past Surgical History:  Procedure Laterality Date  . broken elbow Right   . brooken toe Right   . EYE SURGERY    . infected knee relpacement Right 2017  . JOINT REPLACEMENT Right 07/29/2015   knee  . 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 TUBAL LIGATION     Family History  Problem Relation Age of Onset  . Alcohol abuse Mother   . Hyperlipidemia Mother   . Hypertension Mother   . Stroke Mother   . Depression Mother   . Aneurysm Mother   . Alcohol abuse Father   . Heart attack Father   . Cancer Father        lung and throat cancer  . Diabetes Maternal Grandmother   . Diabetes Maternal Grandfather    Social History   Socioeconomic History  . Marital status: Divorced    Spouse name: Not on file  . Number of children: Not on file  . Years of education: Not on file  . Highest education level: Not on file  Occupational History  . Not on file  Social Needs  . Financial resource strain: Not on file  . Food insecurity    Worry: Not on file    Inability: Not on file  . Transportation needs    Medical: Not on file    Non-medical: Not on file  Tobacco Use  .  Smoking status: Never Smoker  . Smokeless tobacco: Never Used  Substance and Sexual Activity  . Alcohol use: Yes    Alcohol/week: 1.0 standard drinks    Types: 1 Glasses of wine per week    Comment: rare  . Drug use: No  . Sexual activity: Never    Birth control/protection: None  Lifestyle  . Physical activity    Days per week: Not on file    Minutes per session: Not on file  . Stress: Not on file  Relationships  . Social Herbalist on phone: Not on file    Gets together: Not on file    Attends religious service: Not on file    Active member of club or organization: Not on file    Attends meetings of clubs or organizations: Not on file    Relationship status: Not on file  Other Topics Concern  . Not on file  Social History Narrative  . Not on file    Outpatient Encounter Medications as of 01/31/2019  Medication Sig  . AMBULATORY NON FORMULARY MEDICATION Mask for CPAP machine that goes over nose and mouth.  . budesonide-formoterol (SYMBICORT) 80-4.5  MCG/ACT inhaler Inhale 2 puffs into the lungs 2 (two) times daily.  Marland Kitchen buPROPion (WELLBUTRIN XL) 300 MG 24 hr tablet Take 1 tablet (300 mg total) by mouth daily. 90 day also sent to mail order  . celecoxib (CELEBREX) 200 MG capsule Take 1 capsule (200 mg total) by mouth 2 (two) times daily.  . DULoxetine (CYMBALTA) 60 MG capsule Take 1 capsule (60 mg total) by mouth 2 (two) times daily.  Marland Kitchen ECHINACEA PO Take by mouth.  . famotidine (PEPCID) 20 MG tablet Take 1 tablet (20 mg total) by mouth 2 (two) times daily.  . fexofenadine (ALLEGRA) 180 MG tablet Take 180 mg by mouth daily.   Marland Kitchen HYDROcodone-acetaminophen (NORCO/VICODIN) 5-325 MG tablet Take 1 tablet by mouth every 8 (eight) hours as needed for moderate pain.  Marland Kitchen lisinopril (PRINIVIL,ZESTRIL) 5 MG tablet Take 1 tablet (5 mg total) by mouth daily.  . metroNIDAZOLE (METROCREAM) 0.75 % cream Apply 1 application topically 2 (two) times daily as needed (for rosacea).   . montelukast (SINGULAIR) 10 MG tablet Take 1 tablet (10 mg total) by mouth at bedtime.  . Multiple Vitamins-Minerals (ONE-A-DAY WOMENS 50 PLUS PO) Take 1 tablet by mouth daily.   . Omega-3 Fatty Acids (OMEGA 3 PO) Take by mouth.  . pantoprazole (PROTONIX) 40 MG tablet Take 1 tablet (40 mg total) by mouth daily.  . Probiotic Product (PROBIOTIC DAILY PO) Take 1 capsule by mouth daily.   Marland Kitchen triamcinolone cream (KENALOG) 0.1 % Apply 1 application topically 2 (two) times daily as needed (for eczema/psoriasis).  . VENTOLIN HFA 108 (90 Base) MCG/ACT inhaler Inhale 2 puffs into the lungs every 6 (six) hours as needed for wheezing or shortness of breath.   . zolpidem (AMBIEN) 5 MG tablet Take 1 tablet (5 mg total) by mouth at bedtime as needed for sleep.   No facility-administered encounter medications on file as of 01/31/2019.     Activities of Daily Living No flowsheet data found.  Patient Care Team: Lavada Mesi as PCP - General (Family Medicine)    Assessment:   This is a  routine wellness examination for Monica Page.Physical assessment deferred to PCP.   Exercise Activities and Dietary recommendations   Diet Breakfast: Lunch:  Dinner:       Goals   None  Fall Risk Fall Risk  04/30/2018 03/20/2017  Falls in the past year? No Yes  Number falls in past yr: - 2 or more  Injury with Fall? - No   Is the patient's home free of loose throw rugs in walkways, pet beds, electrical cords, etc?   {Blank single:19197::"yes","no"}      Grab bars in the bathroom? {Blank single:19197::"yes","no"}      Handrails on the stairs?   {Blank single:19197::"yes","no"}      Adequate lighting?   {Blank single:19197::"yes","no"}   Depression Screen PHQ 2/9 Scores 08/24/2018 08/23/2018 04/30/2018 01/22/2018  PHQ - 2 Score 4 5 0 2  PHQ- 9 Score 8 12 - 5  Exception Documentation - - - Medical reason  Some encounter information is confidential and restricted. Go to Review Flowsheets activity to see all data.     Cognitive Function     6CIT Screen 05/03/2018  What Year? 0 points  What month? 0 points  What time? 0 points  Count back from 20 0 points  Months in reverse 0 points  Repeat phrase 0 points  Total Score 0    Immunization History  Administered Date(s) Administered  . Influenza, High Dose Seasonal PF 03/20/2017  . Influenza,inj,Quad PF,6+ Mos 04/30/2018    Screening Tests Health Maintenance  Topic Date Due  . INFLUENZA VACCINE  02/26/2019  . MAMMOGRAM  03/04/2020  . COLONOSCOPY  03/22/2025  . TETANUS/TDAP  07/28/2026  . DEXA SCAN  Completed  . Hepatitis C Screening  Completed  . PNA vac Low Risk Adult  Completed       Plan:   ***   I have personally reviewed and noted the following in the patient's chart:   . Medical and social history . Use of alcohol, tobacco or illicit drugs  . Current medications and supplements . Functional ability and status . Nutritional status . Physical activity . Advanced directives . List of other  physicians . Hospitalizations, surgeries, and ER visits in previous 12 months . Vitals . Screenings to include cognitive, depression, and falls . Referrals and appointments  In addition, I have reviewed and discussed with patient certain preventive protocols, quality metrics, and best practice recommendations. A written personalized care plan for preventive services as well as general preventive health recommendations were provided to patient.     Joanne Chars, LPN  0/03/8118

## 2019-01-31 ENCOUNTER — Encounter: Payer: Self-pay | Admitting: Physician Assistant

## 2019-01-31 ENCOUNTER — Ambulatory Visit (INDEPENDENT_AMBULATORY_CARE_PROVIDER_SITE_OTHER): Payer: Medicare Other | Admitting: Physician Assistant

## 2019-01-31 ENCOUNTER — Ambulatory Visit: Payer: 59

## 2019-01-31 VITALS — BP 158/77 | HR 82 | Temp 98.5°F | Wt 155.0 lb

## 2019-01-31 DIAGNOSIS — R197 Diarrhea, unspecified: Secondary | ICD-10-CM | POA: Diagnosis not present

## 2019-01-31 DIAGNOSIS — R109 Unspecified abdominal pain: Secondary | ICD-10-CM

## 2019-01-31 DIAGNOSIS — K648 Other hemorrhoids: Secondary | ICD-10-CM | POA: Diagnosis not present

## 2019-01-31 DIAGNOSIS — K922 Gastrointestinal hemorrhage, unspecified: Secondary | ICD-10-CM

## 2019-01-31 DIAGNOSIS — K219 Gastro-esophageal reflux disease without esophagitis: Secondary | ICD-10-CM

## 2019-01-31 MED ORDER — SULFAMETHOXAZOLE-TRIMETHOPRIM 800-160 MG PO TABS: 1.0000 | ORAL_TABLET | Freq: Two times a day (BID) | ORAL | 0 refills | Status: AC

## 2019-01-31 MED ORDER — PANTOPRAZOLE SODIUM 40 MG PO TBEC: 40.0000 mg | DELAYED_RELEASE_TABLET | Freq: Two times a day (BID) | ORAL | 3 refills | Status: DC

## 2019-01-31 MED ORDER — METRONIDAZOLE 500 MG PO TABS: 500.0000 mg | ORAL_TABLET | Freq: Three times a day (TID) | ORAL | 0 refills | Status: AC

## 2019-01-31 NOTE — Progress Notes (Signed)
Virtual Visit via Video Note  I connected with Monica Page on 01/31/19 at  9:10 AM EDT by a video enabled telemedicine application and verified that I am speaking with the correct person using two identifiers.   I discussed the limitations of evaluation and management by telemedicine and the availability of in person appointments. The patient expressed understanding and agreed to proceed.  History of Present Illness: HPI:                                                                Monica Page is a 68 y.o. female   CC: diarrhea/blood in stool  Patient with PMH of GERD, gastritis, internal hemorrhoids reports she has had bouts of diarrhea before that she attributes to certain foods. On Friday she had several episodes of diarrhea, nausea, vomiting and lightheadedness. She states there was a lot of blood in the diarrhea. She states stool was bright red. She states temp was elevated at 99.7. Last episode of bloody diarrhea was on Saturday. Has not had a BM since that time. No hematemesis or coffee ground emesis. She endorses a pain in the left side of her abdomen described as "tenderness."  Continues to endorse mild, left-sided abdominal tenderness today.  She takes Celebrex 200 mg twice a day. She admits she also takes 2  Aleve at bedtime. She drinks an occasional glass of wine once a month. She has been taking her Pantoprazole 40 mg daily.  Denies chills, myalgia, URI symptoms, cough. Denies any sick contacts. She states she has only left her house five times since the start of the pandemic and is always practicing social distancing.   Colonoscopy 02/2015 showed small, nonbleeding grade 1 internal hemorrhoids    Past Medical History:  Diagnosis Date  . Anemia    hx. of anemia  . Anxiety   . Arthritis   . Asthma   . 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 Complication of anesthesia    asthma attack coming out of anesthesia  . Depression   . GERD (gastroesophageal reflux  disease)   . Sleep apnea    CPAP does not use   Past Surgical History:  Procedure Laterality Date  . broken elbow Right   . brooken toe Right   . EYE SURGERY    . infected knee relpacement Right 2017  . JOINT REPLACEMENT Right 07/29/2015   knee  . 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 TUBAL LIGATION     Social History   Tobacco Use  . Smoking status: Never Smoker  . Smokeless tobacco: Never Used  Substance Use Topics  . Alcohol use: Yes    Alcohol/week: 1.0 standard drinks    Types: 1 Glasses of wine per week    Comment: rare   family history includes Alcohol abuse in her father and mother; Aneurysm in her mother; Cancer in her father; Depression in her mother; Diabetes in her maternal grandfather and maternal grandmother; Heart attack in her father; Hyperlipidemia in her mother; Hypertension in her mother; Stroke in her mother.    ROS: negative except as  noted in the HPI  Medications: Current Outpatient Medications  Medication Sig Dispense Refill  . AMBULATORY NON FORMULARY MEDICATION Mask for CPAP machine that goes over nose and mouth. 2 application 0  . budesonide-formoterol (SYMBICORT) 80-4.5 MCG/ACT inhaler Inhale 2 puffs into the lungs 2 (two) times daily. 3 Inhaler 0  . buPROPion (WELLBUTRIN XL) 300 MG 24 hr tablet Take 1 tablet (300 mg total) by mouth daily. 90 day also sent to mail order 30 tablet 0  . celecoxib (CELEBREX) 200 MG capsule Take 1 capsule (200 mg total) by mouth 2 (two) times daily. 180 capsule 1  . DULoxetine (CYMBALTA) 60 MG capsule Take 1 capsule (60 mg total) by mouth 2 (two) times daily. 180 capsule 1  . ECHINACEA PO Take by mouth.    . famotidine (PEPCID) 20 MG tablet Take 1 tablet (20 mg total) by mouth 2 (two) times daily. 180 tablet 3  . fexofenadine (ALLEGRA) 180 MG  tablet Take 180 mg by mouth daily.     Marland Kitchen lisinopril (PRINIVIL,ZESTRIL) 5 MG tablet Take 1 tablet (5 mg total) by mouth daily. 180 tablet 1  . metroNIDAZOLE (METROCREAM) 0.75 % cream Apply 1 application topically 2 (two) times daily as needed (for rosacea).   5  . montelukast (SINGULAIR) 10 MG tablet Take 1 tablet (10 mg total) by mouth at bedtime. 90 tablet 3  . Multiple Vitamins-Minerals (ONE-A-DAY WOMENS 50 PLUS PO) Take 1 tablet by mouth daily.     . Omega-3 Fatty Acids (OMEGA 3 PO) Take by mouth.    . pantoprazole (PROTONIX) 40 MG tablet Take 1 tablet (40 mg total) by mouth daily. 90 tablet 3  . Probiotic Product (PROBIOTIC DAILY PO) Take 1 capsule by mouth daily.     . VENTOLIN HFA 108 (90 Base) MCG/ACT inhaler Inhale 2 puffs into the lungs every 6 (six) hours as needed for wheezing or shortness of breath.     . zolpidem (AMBIEN) 5 MG tablet Take 1 tablet (5 mg total) by mouth at bedtime as needed for sleep. 90 tablet 1  . HYDROcodone-acetaminophen (NORCO/VICODIN) 5-325 MG tablet Take 1 tablet by mouth every 8 (eight) hours as needed for moderate pain. (Patient not taking: Reported on 01/31/2019) 15 tablet 0  . triamcinolone cream (KENALOG) 0.1 % Apply 1 application topically 2 (two) times daily as needed (for eczema/psoriasis). (Patient not taking: Reported on 01/31/2019) 30 g 1   No current facility-administered medications for this visit.    Allergies  Allergen Reactions  . Azithromycin     UNSPECIFIED REACTION   . Doxycycline     UNSPECIFIED REACTION   . Levofloxacin     UNSPECIFIED REACTION   . Penicillins Rash       Objective:  BP (!) 158/77   Pulse 82   Temp 98.5 F (36.9 C) (Oral)   Wt 155 lb (70.3 kg)   LMP 07/28/2010   BMI 27.46 kg/m  Gen:  alert, not ill-appearing, no distress, appropriate for age 31: head normocephalic without obvious abnormality, conjunctiva and cornea clear, wearing glasses, trachea midline Pulm: Normal work of breathing, normal  phonation Neuro: alert and oriented x 3 Psych: cooperative, speech is articulate, normal rate and volume; thought processes clear and goal-directed, normal judgment, good insight   Lab Results  Component Value Date   CREATININE 0.99 12/15/2017   BUN 15 12/15/2017   NA 137 12/15/2017   K 4.4 12/15/2017   CL 103 12/15/2017   CO2 27 12/15/2017   Lab  Results  Component Value Date   ALT 14 12/15/2017   AST 19 12/15/2017   ALKPHOS 101 05/21/2017   BILITOT 0.3 12/15/2017   Lab Results  Component Value Date   WBC 4.1 04/30/2018   HGB 13.5 04/30/2018   HCT 39.9 04/30/2018   MCV 86.2 04/30/2018   PLT 400 04/30/2018   No results found for: IRON, TIBC, FERRITIN   No results found for this or any previous visit (from the past 72 hour(s)). No results found.    Assessment and Plan: 69 y.o. female with   .Carlyn was seen today for diarrhea.  Diagnoses and all orders for this visit:  Acute lower gastrointestinal bleeding -     Ambulatory referral to Gastroenterology  Hemorrhoids, internal  Bloody diarrhea -     sulfamethoxazole-trimethoprim (BACTRIM DS) 800-160 MG tablet; Take 1 tablet by mouth 2 (two) times daily for 7 days. -     metroNIDAZOLE (FLAGYL) 500 MG tablet; Take 1 tablet (500 mg total) by mouth 3 (three) times daily for 7 days.  Left sided abdominal pain -     sulfamethoxazole-trimethoprim (BACTRIM DS) 800-160 MG tablet; Take 1 tablet by mouth 2 (two) times daily for 7 days. -     metroNIDAZOLE (FLAGYL) 500 MG tablet; Take 1 tablet (500 mg total) by mouth 3 (three) times daily for 7 days.   Physical exam limited by virtual visit today Patient provided vital signs reviewed. BP is elevated. No hypotension, tachycardia, or fever. DDx includes acute lower GI bleeding, bleeding internal hemorrhoids, diverticulitis/colitis Stop Celebrex and Aleve Increase Protonix to 40 mg bid Start Bactrim and Flagyl to cover for intraabdominal infection/diverticulitis (of note:  no diverticulosis was noted on her prior Colonoscopy) Follow-up with GI asap this week. If [facility] is unable to see her, she was instructed to schedule an office visit here with myself or PCP tomorrow To the ED for worsening abdominal pain, recurrent bleeding/blood in stool, vomiting blood or coffee ground appearing emesis, recurrent lightheadedness, fainting, or weakness  Detailed instructions reviewed with patient and sent via MyChart  Follow Up Instructions:    I discussed the assessment and treatment plan with the patient. The patient was provided an opportunity to ask questions and all were answered. The patient agreed with the plan and demonstrated an understanding of the instructions.   The patient was advised to call back or seek an in-person evaluation if the symptoms worsen or if the condition fails to improve as anticipated.  I provided 25 minutes of non-face-to-face time during this encounter.   Trixie Dredge, [place]

## 2019-02-01 DIAGNOSIS — R197 Diarrhea, unspecified: Secondary | ICD-10-CM | POA: Diagnosis not present

## 2019-02-01 DIAGNOSIS — R1032 Left lower quadrant pain: Secondary | ICD-10-CM | POA: Diagnosis not present

## 2019-02-01 DIAGNOSIS — K210 Gastro-esophageal reflux disease with esophagitis: Secondary | ICD-10-CM | POA: Diagnosis not present

## 2019-02-01 DIAGNOSIS — K921 Melena: Secondary | ICD-10-CM | POA: Diagnosis not present

## 2019-02-02 ENCOUNTER — Encounter: Payer: Self-pay | Admitting: Physician Assistant

## 2019-02-02 NOTE — Progress Notes (Deleted)
Subjective:   Monica Page is a 68 y.o. female who presents for Medicare Annual (Subsequent) preventive examination.  Review of Systems:  No ROS.  Medicare Wellness Virtual Visit.  Visual/audio telehealth visit, UTA vital signs.   See social history for additional risk factors.      Sleep patterns:   Home Safety/Smoke Alarms: Feels safe in home. Smoke alarms in place.  Living environment;. Seat Belt Safety/Bike Helmet: Wears seat belt.   Female:   Pap-  Aged out    Mammo-   Scheduled on 03/09/19    Dexa scan-        CCS-  UTD      Objective:     Vitals: LMP 07/28/2010   There is no height or weight on file to calculate BMI.  Advanced Directives 06/12/2017 06/01/2017 05/21/2017  Does Patient Have a Medical Advance Directive? Yes Yes No  Type of Advance Directive Healthcare Power of [place] -  Does patient want to make changes to medical advance directive? No - Patient declined No - Patient declined No - Patient declined  Copy of [place] in Chart? No - copy requested No - copy requested -    Tobacco Social History   Tobacco Use  Smoking Status Never Smoker  Smokeless Tobacco Never Used     Counseling given: Not Answered   Clinical Intake:                       Past Medical History:  Diagnosis Date  . Anemia    hx. of anemia  . Anxiety   . Arthritis   . Asthma   . 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 Complication of anesthesia    asthma attack coming out of anesthesia  . Depression   . GERD (gastroesophageal reflux disease)   . Sleep apnea    CPAP does not use   Past Surgical History:  Procedure Laterality Date  . broken elbow Right   . brooken toe Right   . EYE SURGERY    . infected knee relpacement Right 2017  . JOINT REPLACEMENT Right 07/29/2015   knee  . 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 TUBAL LIGATION     Family History  Problem Relation Age of Onset  . Alcohol abuse Mother   . Hyperlipidemia Mother   . Hypertension Mother   . Stroke Mother   . Depression Mother   . Aneurysm Mother   . Alcohol abuse Father   . Heart attack Father   . Cancer Father        lung and throat cancer  . Diabetes Maternal Grandmother   . Diabetes Maternal Grandfather    Social History   Socioeconomic History  . Marital status: Divorced    Spouse name: Not on file  . Number of children: Not on file  . Years of education: Not on file  . Highest education level: Not on file  Occupational History  . Not on file  Social Needs  . Financial resource strain: Not on file  . Food insecurity    Worry: Not on file    Inability: Not on file  . Transportation needs    Medical: Not on file    Non-medical: Not on file  Tobacco Use  . Smoking status: Never Smoker  . Smokeless tobacco: Never Used  Substance and Sexual Activity  . Alcohol use: Yes    Alcohol/week: 1.0 standard drinks    Types: 1 Glasses of wine per week    Comment: rare  . Drug use: No  . Sexual activity: Never    Birth control/protection: None  Lifestyle  . Physical activity    Days per week: Not on file    Minutes per session: Not on file  . Stress: Not on file  Relationships  . Social Herbalist on phone: Not on file    Gets together: Not on file    Attends religious service: Not on file    Active member of club or organization: Not on file    Attends meetings of clubs or organizations: Not on file    Relationship status: Not on file  Other Topics Concern  . Not on file  Social History Narrative  . Not on file    Outpatient Encounter Medications as of 02/07/2019  Medication Sig  . AMBULATORY NON FORMULARY MEDICATION Mask for CPAP machine that goes over nose and mouth.  . budesonide-formoterol (SYMBICORT)  80-4.5 MCG/ACT inhaler Inhale 2 puffs into the lungs 2 (two) times daily.  Marland Kitchen buPROPion (WELLBUTRIN XL) 300 MG 24 hr tablet Take 1 tablet (300 mg total) by mouth daily. 90 day also sent to mail order  . DULoxetine (CYMBALTA) 60 MG capsule Take 1 capsule (60 mg total) by mouth 2 (two) times daily.  Marland Kitchen ECHINACEA PO Take by mouth.  . famotidine (PEPCID) 20 MG tablet Take 1 tablet (20 mg total) by mouth 2 (two) times daily.  . fexofenadine (ALLEGRA) 180 MG tablet Take 180 mg by mouth daily.   Marland Kitchen lisinopril (PRINIVIL,ZESTRIL) 5 MG tablet Take 1 tablet (5 mg total) by mouth daily.  . metroNIDAZOLE (FLAGYL) 500 MG tablet Take 1 tablet (500 mg total) by mouth 3 (three) times daily for 7 days.  . metroNIDAZOLE (METROCREAM) 0.75 % cream Apply 1 application topically 2 (two) times daily as needed (for rosacea).   . montelukast (SINGULAIR) 10 MG tablet Take 1 tablet (10 mg total) by mouth at bedtime.  . Multiple Vitamins-Minerals (ONE-A-DAY WOMENS 50 PLUS PO) Take 1 tablet by mouth daily.   . Omega-3 Fatty Acids (OMEGA 3 PO) Take by mouth.  . pantoprazole (PROTONIX) 40 MG tablet Take 1 tablet (40 mg total) by mouth 2 (two) times daily for 7 days.  . Probiotic Product (PROBIOTIC DAILY PO) Take 1 capsule by mouth daily.   Marland Kitchen sulfamethoxazole-trimethoprim (BACTRIM DS) 800-160 MG tablet Take 1 tablet by mouth 2 (two) times daily for 7 days.  Marland Kitchen triamcinolone cream (KENALOG) 0.1 % Apply 1 application topically 2 (two) times daily as needed (for eczema/psoriasis). (Patient not taking: Reported on 01/31/2019)  . VENTOLIN HFA 108 (90 Base) MCG/ACT inhaler Inhale 2 puffs into the lungs every 6 (six) hours as needed for wheezing or shortness of breath.   . zolpidem (AMBIEN) 5 MG tablet Take 1 tablet (5 mg total) by mouth at bedtime as needed for sleep.   No facility-administered encounter medications on file as of 02/07/2019.     Activities of Daily Living No flowsheet data found.  Patient Care Team: Lavada Mesi as PCP - General (Family Medicine)    Assessment:   This is a routine wellness examination for Hazel.Physical assessment deferred to PCP.   Exercise Activities and Dietary recommendations  Diet  Breakfast: Lunch:  Dinner:       Goals   None     Fall Risk Fall Risk  04/30/2018 03/20/2017  Falls in the past year? No Yes  Number falls in past yr: - 2 or more  Injury with Fall? - No   Is the patient's home free of loose throw rugs in walkways, pet beds, electrical cords, etc?   {Blank single:19197::"yes","no"}      Grab bars in the bathroom? {Blank single:19197::"yes","no"}      Handrails on the stairs?   {Blank single:19197::"yes","no"}      Adequate lighting?   {Blank single:19197::"yes","no"}   Depression Screen PHQ 2/9 Scores 08/24/2018 08/23/2018 04/30/2018 01/22/2018  PHQ - 2 Score 4 5 0 2  PHQ- 9 Score 8 12 - 5  Exception Documentation - - - Medical reason  Some encounter information is confidential and restricted. Go to Review Flowsheets activity to see all data.     Cognitive Function     6CIT Screen 05/03/2018  What Year? 0 points  What month? 0 points  What time? 0 points  Count back from 20 0 points  Months in reverse 0 points  Repeat phrase 0 points  Total Score 0    Immunization History  Administered Date(s) Administered  . Influenza, High Dose Seasonal PF 03/20/2017  . Influenza,inj,Quad PF,6+ Mos 04/30/2018    Screening Tests Health Maintenance  Topic Date Due  . INFLUENZA VACCINE  02/26/2019  . MAMMOGRAM  03/04/2020  . COLONOSCOPY  03/22/2025  . TETANUS/TDAP  07/28/2026  . DEXA SCAN  Completed  . Hepatitis C Screening  Completed  . PNA vac Low Risk Adult  Completed        Plan:   ***   I have personally reviewed and noted the following in the patient's chart:   . Medical and social history . Use of alcohol, tobacco or illicit drugs  . Current medications and supplements . Functional ability and status . Nutritional  status . Physical activity . Advanced directives . List of other physicians . Hospitalizations, surgeries, and ER visits in previous 12 months . Vitals . Screenings to include cognitive, depression, and falls . Referrals and appointments  In addition, I have reviewed and discussed with patient certain preventive protocols, quality metrics, and best practice recommendations. A written personalized care plan for preventive services as well as general preventive health recommendations were provided to patient.     Joanne Chars, LPN  8/0/2233

## 2019-02-04 ENCOUNTER — Encounter: Payer: Self-pay | Admitting: Physician Assistant

## 2019-02-04 ENCOUNTER — Other Ambulatory Visit: Payer: Self-pay | Admitting: Physician Assistant

## 2019-02-04 DIAGNOSIS — F321 Major depressive disorder, single episode, moderate: Secondary | ICD-10-CM

## 2019-02-04 DIAGNOSIS — F5101 Primary insomnia: Secondary | ICD-10-CM

## 2019-02-05 ENCOUNTER — Other Ambulatory Visit: Payer: Self-pay | Admitting: Physician Assistant

## 2019-02-05 DIAGNOSIS — F321 Major depressive disorder, single episode, moderate: Secondary | ICD-10-CM

## 2019-02-07 ENCOUNTER — Ambulatory Visit: Payer: Medicare Other

## 2019-02-07 MED ORDER — ZOLPIDEM TARTRATE 5 MG PO TABS: 5.0000 mg | ORAL_TABLET | Freq: Every evening | ORAL | 1 refills | Status: DC | PRN

## 2019-02-07 MED ORDER — DULOXETINE HCL 60 MG PO CPEP: 60.0000 mg | ORAL_CAPSULE | Freq: Two times a day (BID) | ORAL | 1 refills | Status: DC

## 2019-02-14 ENCOUNTER — Ambulatory Visit: Payer: Medicare Other

## 2019-02-14 NOTE — Progress Notes (Addendum)
Subjective:   Monica Page is a 68 y.o. female who presents for Medicare Annual (Subsequent) preventive examination.  Review of Systems:  No ROS.  Medicare Wellness Virtual Visit.  Visual/audio telehealth visit, UTA vital signs.   See social history for additional risk factors.    Cardiac Risk Factors include: advanced age (>19men, >73 women);sedentary lifestyle;obesity (BMI >30kg/m2) Sleep patterns: Getting 8-9 hours of sleep a night. Wakes up 2 times a night to go void. Wakes up and feels sluggish sometimes.   Home Safety/Smoke Alarms: Feels safe in home. Smoke alarms in place.  Living environment; Lives alone in a 2 story home, steps have handrails on them. Shower is a walk in shower and no grab bars in place. Seat Belt Safety/Bike Helmet: Wears seat belt.   Female:   Pap-  Aged out     [facility]- scheduled for March 09, 2019      Dexa scan-        CCS- UTD     Objective:     Vitals: LMP 07/28/2010   There is no height or weight on file to calculate BMI.  Advanced Directives 02/15/2019 06/12/2017 06/01/2017 05/21/2017  Does Patient Have a Medical Advance Directive? No Yes Yes No  Type of Advance Directive - Healthcare Power of [place] -  Does patient want to make changes to medical advance directive? - No - Patient declined No - Patient declined No - Patient declined  Copy of [place] in Chart? - No - copy requested No - copy requested -  Would patient like information on creating a medical advance directive? No - Patient declined - - -    Tobacco Social History   Tobacco Use  Smoking Status Never Smoker  Smokeless Tobacco Never Used     Counseling given: Not Answered   Clinical Intake:  Pre-visit preparation completed: Yes  Pain : No/denies pain     Nutritional Status: BMI 25 -29 Overweight Nutritional Risks: None Diabetes: No  How often do you need to have someone help you when you read instructions,  pamphlets, or other written materials from your doctor or pharmacy?: 1 - Never What is the last grade level you completed in school?: 16  Interpreter Needed?: No  Information entered by :: Orlie Dakin, LPN  Past Medical History:  Diagnosis Date  . Anemia    hx. of anemia  . Anxiety   . Arthritis   . Asthma   . 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 Colitis, acute   . Complication of anesthesia    asthma attack coming out of anesthesia  . Depression   . GERD (gastroesophageal reflux disease)   . Sleep apnea    CPAP does not use   Past Surgical History:  Procedure Laterality Date  . broken elbow Right   . brooken toe Right   . EYE SURGERY    . infected knee relpacement Right 2017  . JOINT REPLACEMENT Right 07/29/2015   knee  . 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 TUBAL LIGATION     Family History  Problem Relation Age of Onset  . Alcohol abuse Mother   . Hyperlipidemia Mother   . Hypertension Mother   . Stroke Mother   . Depression Mother   . Aneurysm Mother   .  Alcohol abuse Father   . Heart attack Father   . Cancer Father        lung and throat cancer  . Diabetes Maternal Grandmother   . Diabetes Maternal Grandfather    Social History   Socioeconomic History  . Marital status: Divorced    Spouse name: Not on file  . Number of children: Not on file  . Years of education: 68  . Highest education level: Bachelor's degree (e.g., BA, AB, BS)  Occupational History  . Occupation: insurance    Comment: retired  Scientific laboratory technician  . Financial resource strain: Not hard at all  . Food insecurity    Worry: Never true    Inability: Never true  . Transportation needs    Medical: No    Non-medical: No  Tobacco Use  . Smoking status: Never Smoker  . Smokeless tobacco: Never Used  Substance and  Sexual Activity  . Alcohol use: Not Currently    Alcohol/week: 0.0 standard drinks    Comment: 0  . Drug use: No  . Sexual activity: Never    Birth control/protection: None  Lifestyle  . Physical activity    Days per week: 0 days    Minutes per session: 0 min  . Stress: Not at all  Relationships  . Social connections    Talks on phone: More than three times a week    Gets together: Once a week    Attends religious service: Never    Active member of club or organization: No    Attends meetings of clubs or organizations: Never    Relationship status: Divorced  Other Topics Concern  . Not on file  Social History Narrative   Does housework   Does needle work   Likes to read    Outpatient Encounter Medications as of 02/15/2019  Medication Sig  . budesonide-formoterol (SYMBICORT) 80-4.5 MCG/ACT inhaler Inhale 2 puffs into the lungs 2 (two) times daily.  Marland Kitchen buPROPion (WELLBUTRIN XL) 300 MG 24 hr tablet Take 1 tablet (300 mg total) by mouth daily. NEEDS APPT  . DULoxetine (CYMBALTA) 60 MG capsule Take 1 capsule (60 mg total) by mouth 2 (two) times daily.  Marland Kitchen ECHINACEA PO Take by mouth.  . famotidine (PEPCID) 20 MG tablet Take 1 tablet (20 mg total) by mouth 2 (two) times daily.  . fexofenadine (ALLEGRA) 180 MG tablet Take 180 mg by mouth daily.   Marland Kitchen lisinopril (PRINIVIL,ZESTRIL) 5 MG tablet Take 1 tablet (5 mg total) by mouth daily.  . metroNIDAZOLE (METROCREAM) 0.75 % cream Apply 1 application topically 2 (two) times daily as needed (for rosacea).   . montelukast (SINGULAIR) 10 MG tablet Take 1 tablet (10 mg total) by mouth at bedtime.  . Multiple Vitamins-Minerals (ONE-A-DAY WOMENS 50 PLUS PO) Take 1 tablet by mouth daily.   . Omega-3 Fatty Acids (OMEGA 3 PO) Take by mouth.  . Probiotic Product (PROBIOTIC DAILY PO) Take 1 capsule by mouth daily.   . VENTOLIN HFA 108 (90 Base) MCG/ACT inhaler Inhale 2 puffs into the lungs every 6 (six) hours as needed for wheezing or shortness of  breath.   . zolpidem (AMBIEN) 5 MG tablet Take 1 tablet (5 mg total) by mouth at bedtime as needed for sleep.  . AMBULATORY NON FORMULARY MEDICATION Mask for CPAP machine that goes over nose and mouth. (Patient not taking: Reported on 02/15/2019)  . pantoprazole (PROTONIX) 40 MG tablet Take 1 tablet (40 mg total) by mouth 2 (two) times daily  for 7 days.  Marland Kitchen triamcinolone cream (KENALOG) 0.1 % Apply 1 application topically 2 (two) times daily as needed (for eczema/psoriasis). (Patient not taking: Reported on 01/31/2019)   No facility-administered encounter medications on file as of 02/15/2019.     Activities of Daily Living In your present state of health, do you have any difficulty performing the following activities: 02/15/2019  Hearing? Y  Comment sometimes has some hearing loss  Vision? N  Difficulty concentrating or making decisions? N  Walking or climbing stairs? N  Dressing or bathing? N  Doing errands, shopping? N  Preparing Food and eating ? N  Using the Toilet? N  In the past six months, have you accidently leaked urine? N  Do you have problems with loss of bowel control? N  Managing your Medications? N  Managing your Finances? N  Housekeeping or managing your Housekeeping? N  Some recent data might be hidden    Patient Care Team: Lavada Mesi as PCP - General (Family Medicine)    Assessment:   This is a routine wellness examination for [place].Physical assessment deferred to PCP.   Exercise Activities and Dietary recommendations Current Exercise Habits: The patient does not participate in regular exercise at present Diet Eats a healthy diet of fruits and vegetables. Takes calcium vitamin as well as well as vitamin D Breakfast: toast and tea Lunch: yogurt and fruit Dinner: Meat and vegetable and a carbohydrate       Goals    . Exercise 3x per week (30 min per time)     Exercise at least 3 times a week for 30 minutes at a time       Fall Risk Fall Risk   02/15/2019 04/30/2018 03/20/2017  Falls in the past year? 1 No Yes  Number falls in past yr: 0 - 2 or more  Injury with Fall? 1 - No  Risk for fall due to : Other (Comment) - -  Risk for fall due to: Comment has animals cats and dogs at her feet - -  Follow up Falls prevention discussed - -   Is the patient's home free of loose throw rugs in walkways, pet beds, electrical cords, etc?   yes      Grab bars in the bathroom? no      Handrails on the stairs?   yes      Adequate lighting?   yes  Depression Screen PHQ 2/9 Scores 02/15/2019 08/24/2018 08/23/2018 04/30/2018  PHQ - 2 Score 1 4 5  0  PHQ- 9 Score - 8 12 -  Exception Documentation - - - -  Some encounter information is confidential and restricted. Go to Review Flowsheets activity to see all data.     Cognitive Function     6CIT Screen 02/15/2019 05/03/2018  What Year? 0 points 0 points  What month? 0 points 0 points  What time? 0 points 0 points  Count back from 20 0 points 0 points  Months in reverse 0 points 0 points  Repeat phrase 0 points 0 points  Total Score 0 0    Immunization History  Administered Date(s) Administered  . Influenza, High Dose Seasonal PF 03/20/2017  . Influenza,inj,Quad PF,6+ Mos 04/30/2018    Screening Tests Health Maintenance  Topic Date Due  . INFLUENZA VACCINE  02/26/2019  . MAMMOGRAM  03/04/2020  . COLONOSCOPY  03/22/2025  . TETANUS/TDAP  07/28/2026  . DEXA SCAN  Completed  . Hepatitis C Screening  Completed  . PNA  vac Low Risk Adult  Completed       Please schedule your next medicare wellness visit with me in 1 yr.  Ms. Criss , Thank you for taking time to come for your Medicare Wellness Visit. I appreciate your ongoing commitment to your health goals. Please review the following plan we discussed and let me know if I can assist you in the future.   These are the goals we discussed: Goals    . Exercise 3x per week (30 min per time)     Exercise at least 3 times a week for 30  minutes at a time       This is a list of the screening recommended for you and due dates:  Health Maintenance  Topic Date Due  . Flu Shot  02/26/2019  . Mammogram  03/04/2020  . Colon Cancer Screening  03/22/2025  . Tetanus Vaccine  07/28/2026  . DEXA scan (bone density measurement)  Completed  .  Hepatitis C: One time screening is recommended by Center for Disease Control  (CDC) for  adults born from [birth year] through 1966.   Completed  . Pneumonia vaccines  Completed       I have personally reviewed and noted the following in the patient's chart:   . Medical and social history . Use of alcohol, tobacco or illicit drugs  . Current medications and supplements . Functional ability and status . Nutritional status . Physical activity . Advanced directives . List of other physicians . Hospitalizations, surgeries, and ER visits in previous 12 months . Vitals . Screenings to include cognitive, depression, and falls . Referrals and appointments  In addition, I have reviewed and discussed with patient certain preventive protocols, quality metrics, and best practice recommendations. A written personalized care plan for preventive services as well as general preventive health recommendations were provided to patient.     Joanne Chars, LPN  12/05/209   .Marland KitchenMedical screening examination/treatment was performed by qualified clinical staff member and as supervising physician I was immediately available for consultation/collaboration. I have reviewed documentation and agree with assessment and plan.  Iran Planas, PA-C

## 2019-02-15 ENCOUNTER — Ambulatory Visit (INDEPENDENT_AMBULATORY_CARE_PROVIDER_SITE_OTHER): Payer: Medicare Other | Admitting: *Deleted

## 2019-02-15 VITALS — BP 145/79 | Temp 97.0°F | Ht 65.0 in | Wt 155.0 lb

## 2019-02-15 DIAGNOSIS — Z13820 Encounter for screening for osteoporosis: Secondary | ICD-10-CM | POA: Diagnosis not present

## 2019-02-15 DIAGNOSIS — Z780 Asymptomatic menopausal state: Secondary | ICD-10-CM

## 2019-02-15 DIAGNOSIS — Z0000 Encounter for general adult medical examination without abnormal findings: Secondary | ICD-10-CM

## 2019-02-15 NOTE — Patient Instructions (Addendum)
Please schedule your next medicare wellness visit with me in 1 yr.  Ms. Mend[place] , Thank you for taking time to come for your Medicare Wellness Visit. I appreciate your ongoing commitment to your health goals. Please review the following plan we discussed and let me know if I can assist you in the future.   These are the goals we discussed: Goals    . Exercise 3x per week (30 min per time)     Exercise at least 3 times a week for 30 minutes at a time

## 2019-02-16 NOTE — Addendum Note (Signed)
Addended by: Beatrice Lecher D on: 02/16/2019 11:59 AM   Modules accepted: Orders

## 2019-02-21 ENCOUNTER — Ambulatory Visit: Payer: Medicare Other | Admitting: Physician Assistant

## 2019-02-23 ENCOUNTER — Other Ambulatory Visit: Payer: Self-pay

## 2019-02-23 ENCOUNTER — Ambulatory Visit (INDEPENDENT_AMBULATORY_CARE_PROVIDER_SITE_OTHER): Payer: Medicare Other | Admitting: Physician Assistant

## 2019-02-23 ENCOUNTER — Encounter: Payer: Self-pay | Admitting: Physician Assistant

## 2019-02-23 VITALS — BP 119/64 | HR 103 | Temp 97.9°F | Ht 66.0 in | Wt 154.0 lb

## 2019-02-23 DIAGNOSIS — F321 Major depressive disorder, single episode, moderate: Secondary | ICD-10-CM

## 2019-02-23 DIAGNOSIS — L219 Seborrheic dermatitis, unspecified: Secondary | ICD-10-CM

## 2019-02-23 DIAGNOSIS — I10 Essential (primary) hypertension: Secondary | ICD-10-CM

## 2019-02-23 DIAGNOSIS — J4520 Mild intermittent asthma, uncomplicated: Secondary | ICD-10-CM

## 2019-02-23 DIAGNOSIS — M1612 Unilateral primary osteoarthritis, left hip: Secondary | ICD-10-CM

## 2019-02-23 DIAGNOSIS — G4730 Sleep apnea, unspecified: Secondary | ICD-10-CM

## 2019-02-23 MED ORDER — DICLOFENAC SODIUM 1 % TD GEL: 2.0000 g | Freq: Four times a day (QID) | TRANSDERMAL | 1 refills | Status: DC | PRN

## 2019-02-23 MED ORDER — CLOBETASOL PROPIONATE 0.05 % EX SOLN: 1.0000 "application " | Freq: Two times a day (BID) | CUTANEOUS | 1 refills | Status: AC

## 2019-02-23 MED ORDER — BUDESONIDE-FORMOTEROL FUMARATE 80-4.5 MCG/ACT IN AERO: 2.0000 | INHALATION_SPRAY | Freq: Two times a day (BID) | RESPIRATORY_TRACT | 0 refills | Status: AC

## 2019-02-23 MED ORDER — AMBULATORY NON FORMULARY MEDICATION: 0 refills | Status: AC

## 2019-02-23 MED ORDER — LISINOPRIL 5 MG PO TABS: 5.0000 mg | ORAL_TABLET | Freq: Every day | ORAL | 1 refills | Status: DC

## 2019-02-23 NOTE — Progress Notes (Signed)
Subjective:    Patient ID: Monica Page, female    DOB: April 01, 1951, 68 y.o.   MRN: [id number]  HPI  Pt is a 68 yo female with pmhx of HTN, MDD, OSA, asthma who presents for her 6 month follow up.   HTN- doing well. No CP, palpitations, headaches. Taking medication daily.   Mood- doing well. No concerns or problems.   Pt has IBS and has intermittent loose stools. No more blood in stool. 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 She is concerned about her loose stools now. She is on probiotic. She denies any prolonged abdominal pain.   Her overall joint pain has increased since stopping celebrex after her ischemic colitis issue. She wonders if she can restart. She is having more and more problems with her left hip. Trouble walking, standing, getting in car and walking up steps.  Xray showed arthritis.   Needs refill on steroid shampoo for seb derm. Doing well.   She needs another rx for CPAP mask previous company went out of business.   .. Active Ambulatory Problems    Diagnosis Date Noted  . Bladder cancer ([place]) 03/20/2017  . Hematuria 03/20/2017  . 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 Iron deficiency anemia 03/20/2017  . Postmenopausal 03/20/2017  . Elevated TSH 03/20/2017  . No energy 03/20/2017  . Moderate major depression ([place]) 03/20/2017  . Mild intermittent asthma without complication 02/40/9735  . Eczema 03/20/2017  . Gastroesophageal reflux disease 03/20/2017  . Normocytic anemia 03/22/2017  . Pain of left thumb 03/23/2017  . Degenerative arthritis of left knee 05/29/2017  . 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 Elevated fasting glucose 06/16/2017  . 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 Elevated liver enzymes 06/16/2017  . Serum albumin decreased 06/16/2017  . Hair loss 12/18/2017  . Leg length discrepancy 12/18/2017  . Acute bilateral low back pain without sciatica 12/18/2017  . Elevated blood pressure  reading 12/18/2017  . Essential hypertension 01/22/2018  . Sleep apnea 05/03/2018  . Elevated LDL cholesterol level 06/01/2018  . History of bladder cancer 09/24/2011  . History of Barrett's esophagus 10/07/2014  . Hemorrhoids, internal 03/30/2015  . Gastroesophageal reflux disease with esophagitis 01/17/2015  . 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 Dry eye syndrome 11/02/2013  . 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 Nuclear sclerotic cataract of right eye 11/02/2013  . Pseudophakia, left eye 11/02/2013  . Rosacea 04/07/2012  . Left hip pain 11/23/2018  . Acute lower gastrointestinal bleeding 01/31/2019  . Osteoporosis screening 02/15/2019  . 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 No Resolved Ambulatory Problems   Past Medical History:  Diagnosis Date  . Anemia   . Anxiety   . Arthritis   . Asthma   . Cancer ([place]) 07/28/2004  . Colitis, acute   . Complication of anesthesia   . Depression   . GERD (gastroesophageal reflux disease)       Review of Systems  All other systems reviewed and are negative.      Objective:   Physical Exam Vitals signs reviewed.  Constitutional:      Appearance: Normal appearance.  HENT:     Head: Normocephalic.  Cardiovascular:     Rate and Rhythm: Normal rate and regular rhythm.     Pulses: Normal pulses.  Pulmonary:     Effort: Pulmonary effort is  normal.     Breath sounds: Normal breath sounds.  Abdominal:     General: Abdomen is flat. There is no distension.     Palpations: Abdomen is soft.     Tenderness: There is no abdominal tenderness. There is no guarding or rebound.  Musculoskeletal:     Comments: Pain with external passive and active ROM of left hip.  No pain over bursa.   Neurological:     General: No focal deficit present.     Mental Status: She is alert and oriented to person, place, and time.   Psychiatric:        Mood and Affect: Mood normal.        Behavior: Behavior normal.           Assessment & Plan:  Marland KitchenMarland KitchenAdaria was seen today for hypertension.  Diagnoses and all orders for this visit:  Primary osteoarthritis of left hip -     diclofenac sodium (VOLTAREN) 1 % GEL; Apply 2 g topically 4 (four) times daily as needed.  Mild intermittent asthma without complication -     budesonide-formoterol (SYMBICORT) 80-4.5 MCG/ACT inhaler; Inhale 2 puffs into the lungs 2 (two) times daily.  Moderate major depression (HCC)  Essential hypertension -     lisinopril (ZESTRIL) 5 MG tablet; Take 1 tablet (5 mg total) by mouth daily.  Sleep apnea, unspecified type -     AMBULATORY NON FORMULARY MEDICATION; Mask for CPAP machine that goes over nose and mouth.  Seborrheic dermatitis -     clobetasol (TEMOVATE) 0.05 % external solution; Apply 1 application topically 2 (two) times daily.  .. Depression screen [facility] 2/9 02/23/2019 02/15/2019 08/24/2018 08/23/2018 04/30/2018  Decreased Interest 2 0 2 3 0  Down, Depressed, Hopeless 1 1 2 2  0  PHQ - 2 Score 3 1 4 5  0  Altered sleeping 1 - 1 1 -  Tired, decreased energy 2 - 1 1 -  Change in appetite 1 - 1 1 -  Feeling bad or failure about yourself  0 - 1 3 -  Trouble concentrating 0 - 0 1 -  Moving slowly or fidgety/restless 0 - 0 0 -  Suicidal thoughts 0 - 0 0 -  PHQ-9 Score 7 - 8 12 -  Difficult doing work/chores Somewhat difficult - Somewhat difficult Very difficult -  Some encounter information is confidential and restricted. Go to Review Flowsheets activity to see all data.   .. GAD 7 : Generalized Anxiety Score 02/23/2019 08/24/2018 08/23/2018 01/22/2018  Nervous, Anxious, on Edge 1 2 2 1   Control/stop worrying 1 3 3 1   Worry too much - different things 1 3 3 1   Trouble relaxing 1 2 2 1   Restless 0 2 2 0  Easily annoyed or irritable 0 3 3 1   Afraid - awful might happen 1 3 3 1   Total GAD 7 Score 5 18 18 6   Anxiety Difficulty Not  difficult at all Very difficult Very difficult Somewhat difficult  Some encounter information is confidential and restricted. Go to Review Flowsheets activity to see all data.    Refills given.   For now stay off celebrex. Will reach out to GI and see thoughts on restarting.  Could benefit from hip injection for arthritis of left hip. Follow up with Dr. Darene Lamer or Dr. Georgina Snell. voltaren gel given to use over achy body parts. Discuss exercises to strengthen the muscles around hip.   I suspect loose stool are IBS. Seems classic IBS-D. Continue probiotic.  Watch for food triggers and avoid.   Follow up with me in 6 months.

## 2019-02-23 NOTE — Patient Instructions (Signed)

## 2019-02-24 ENCOUNTER — Telehealth: Payer: Self-pay | Admitting: Neurology

## 2019-02-24 NOTE — Telephone Encounter (Signed)
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   Looking in patient's chart it looks like she used [facility] who has changed their name to [facility], but still in operation. Order sent to [facility] to [phone] with confirmation received. Left message on machine for patient to call back to make her aware.

## 2019-02-25 NOTE — Telephone Encounter (Signed)
Patient made aware of recommendations. Will call Dr. Erlene Quan and call patient back if they advice anything different.    She has already been in contact with [facility] about CPAP supplies.

## 2019-02-25 NOTE — Telephone Encounter (Signed)
Jade,   Can you call patient and let her know I talked to another provider in office and we agree with think it is ok to restart your celebrex for now. Monitor GI symptoms closely. Any worsens stop and call us.   We are still going to call Dr. Erlene Quan at [facility] office and ask his opinion and let you know.   Please ask Dr. Erlene Quan ok to restart celebrex due to moderate to severe arthritic pain. She stopped with ischemic colitis. Do you recommend she stay off NSAIDs or ok to monitor symptoms and restart?

## 2019-02-28 ENCOUNTER — Encounter: Payer: Self-pay | Admitting: Physician Assistant

## 2019-02-28 NOTE — Telephone Encounter (Signed)
Spoke with Nira Conn with digestive health. She is sending a message to Dr. Cecille Po assistant to get direction.

## 2019-03-01 ENCOUNTER — Institutional Professional Consult (permissible substitution): Payer: Medicare Other | Admitting: Family Medicine

## 2019-03-01 NOTE — Telephone Encounter (Signed)
Dr. Erlene Quan called back and states okay to start Celebrex and monitor symptoms.

## 2019-03-01 NOTE — Telephone Encounter (Signed)
Does [facility] need PA on these medications? I think I sent something over for this already.

## 2019-03-02 NOTE — Telephone Encounter (Signed)
The patient currently has access to the requested medication and a Prior Authorization is not needed for the patient/medication. 

## 2019-03-07 ENCOUNTER — Other Ambulatory Visit: Payer: Self-pay | Admitting: Physician Assistant

## 2019-03-07 DIAGNOSIS — M545 Low back pain, unspecified: Secondary | ICD-10-CM

## 2019-03-09 ENCOUNTER — Other Ambulatory Visit: Payer: 59

## 2019-03-09 ENCOUNTER — Ambulatory Visit: Payer: 59

## 2019-03-10 ENCOUNTER — Institutional Professional Consult (permissible substitution): Payer: Medicare Other | Admitting: Family Medicine

## 2019-03-14 ENCOUNTER — Ambulatory Visit (INDEPENDENT_AMBULATORY_CARE_PROVIDER_SITE_OTHER): Payer: Medicare Other | Admitting: Family Medicine

## 2019-03-14 ENCOUNTER — Encounter: Payer: Self-pay | Admitting: Family Medicine

## 2019-03-14 ENCOUNTER — Other Ambulatory Visit: Payer: Self-pay

## 2019-03-14 VITALS — BP 140/68 | HR 106 | Temp 98.1°F | Wt 158.7 lb

## 2019-03-14 DIAGNOSIS — M25552 Pain in left hip: Secondary | ICD-10-CM

## 2019-03-14 NOTE — Patient Instructions (Signed)
Thank you for coming in today. Call or go to the ER if you develop a large red swollen joint with extreme pain or oozing puss.  I think it is likely that you will need a hip replacement.  The cortisone component will start working in 1-3 days.  Pay attention to how you feel with your hip numbed up.   Recheck as needed.

## 2019-03-15 NOTE — Progress Notes (Signed)
Monica Page is a 68 y.o. female who presents to [place] today for left hip pain. Patient has had ongoing anterior hip pain for several months now.  She denies any specific injury.  She was evaluated by my partner Dr. Dianah Field in April.  X-ray at that time showed moderate to more severe left hip DJD.  She is had trials of conservative management including home exercise program and oral medications with little benefit.  She notes pain is present with activity and with hip flexion activities.  She denies any radiating pain weakness or numbness fevers or chills.  She feels well otherwise.  She like to avoid total hip replacement if possible as she had difficulty with total knee replacement bilaterally.  She notes however her pain is quite bothersome and does interfere with her quality of life.    ROS:  As above  Exam:  BP 140/68 (BP Location: Left Arm, Patient Position: Sitting, Cuff Size: Normal)   Pulse (!) 106   Temp 98.1 F (36.7 C) (Oral)   Wt 158 lb 11.2 oz (72 kg)   LMP 07/28/2010   BMI 25.61 kg/m  Wt Readings from Last 5 Encounters:  03/14/19 158 lb 11.2 oz (72 kg)  02/23/19 154 lb (69.9 kg)  02/15/19 155 lb (70.3 kg)  01/31/19 155 lb (70.3 kg)  11/23/18 158 lb (71.7 kg)   General: Well Developed, well nourished, and in no acute distress.  Neuro/Psych: Alert and oriented x3, extra-ocular muscles intact, able to move all 4 extremities, sensation grossly intact. Skin: Warm and dry, no rashes noted.  Respiratory: Not using accessory muscles, speaking in full sentences, trachea midline.  Cardiovascular: Pulses palpable, no extremity edema. Abdomen: Does not appear distended. MSK: Left hip: Normal-appearing nontender decreased motion especially internal rotation and flexion.  Antalgic gait present.    Lab and Radiology Results EXAM: DG HIP (WITH OR WITHOUT PELVIS) 2-3V LEFT  COMPARISON:  None.  FINDINGS: There is  no evidence of hip fracture or dislocation. Severe narrowing of left hip joint is noted in superior orientation with mild osteophyte formation. Calcified uterine fibroid is noted in the pelvis.  IMPRESSION: Severe degenerative joint disease of the left hip. No acute abnormality is noted.   Electronically Signed   By: Marijo Conception M.D.   On: 11/23/2018 15:18 I personally (independently) visualized and performed the interpretation of the images attached in this note.   Procedure: Real-time Ultrasound Guided Injection of left hip pain Device: GE Logiq E   Images permanently stored and available for review in the ultrasound unit. Verbal informed consent obtained.  Discussed risks and benefits of procedure. Warned about infection bleeding damage to structures skin hypopigmentation and fat atrophy among others. Patient expresses understanding and agreement Time-out conducted.   Noted no overlying erythema, induration, or other signs of local infection.   Skin prepped in a sterile fashion.   Local anesthesia: Topical Ethyl chloride.   With sterile technique and under real time ultrasound guidance:  40 mg of Depo-Medrol and 3 mL of Marcaine injected easily.   Completed without difficulty   Pain immediately resolved suggesting accurate placement of the medication.   Advised to call if fevers/chills, erythema, induration, drainage, or persistent bleeding.   Images permanently stored and available for review in the ultrasound unit.  Impression: Technically successful ultrasound guided injection.       Assessment and Plan: 68 y.o. female with left hip pain.  Significant degenerative changes present.  Plan to proceed with injection as above.  Recheck back if pain does not have any lasting benefit.  Likely at that point will refer to orthopedic surgery.  Return sooner if needed.   PDMP not reviewed this encounter. No orders of the defined types were placed in this encounter.  No  orders of the defined types were placed in this encounter.   Historical information moved to improve visibility of documentation.  Past Medical History:  Diagnosis Date  . Anemia    hx. of anemia  . Anxiety   . Arthritis   . Asthma   . 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 Colitis, acute   . Complication of anesthesia    asthma attack coming out of anesthesia  . Depression   . GERD (gastroesophageal reflux disease)   . Sleep apnea    CPAP does not use   Past Surgical History:  Procedure Laterality Date  . broken elbow Right   . brooken toe Right   . EYE SURGERY    . infected knee relpacement Right 2017  . JOINT REPLACEMENT Right 07/29/2015   knee  . 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 TOTAL KNEE ARTHROPLASTY Left 06/01/2017   Procedure: TOTAL KNEE ARTHROPLASTY;  Surgeon: Frederik Pear, MD;  Location: [place];  Service: Orthopedics;  Laterality: Left;  . TUBAL LIGATION     Social History   Tobacco Use  . Smoking status: Never Smoker  . Smokeless tobacco: Never Used  Substance Use Topics  . Alcohol use: Not Currently    Alcohol/week: 0.0 standard drinks    Comment: 0   family history includes Alcohol abuse in her father and mother; Aneurysm in her mother; Cancer in her father; Depression in her mother; Diabetes in her maternal grandfather and maternal grandmother; Heart attack in her father; Hyperlipidemia in her mother; Hypertension in her mother; Stroke in her mother.  Medications: Current Outpatient Medications  Medication Sig Dispense Refill  . AMBULATORY NON FORMULARY MEDICATION Mask for CPAP machine that goes over nose and mouth. 2 application 0  . budesonide-formoterol (SYMBICORT) 80-4.5 MCG/ACT inhaler Inhale 2 puffs into the lungs 2 (two) times daily. 3 Inhaler 0  . buPROPion (WELLBUTRIN XL) 300 MG 24 hr tablet Take 1 tablet (300 mg total) by mouth daily. NEEDS APPT 90 tablet 0  . clobetasol (TEMOVATE) 0.05 % external solution Apply 1 application  topically 2 (two) times daily. 150 mL 1  . diclofenac sodium (VOLTAREN) 1 % GEL Apply 2 g topically 4 (four) times daily as needed. 300 g 1  . DULoxetine (CYMBALTA) 60 MG capsule Take 1 capsule (60 mg total) by mouth 2 (two) times daily. 180 capsule 1  . ECHINACEA PO Take by mouth.    . famotidine (PEPCID) 20 MG tablet Take 1 tablet (20 mg total) by mouth 2 (two) times daily. 180 tablet 3  . fexofenadine (ALLEGRA) 180 MG tablet Take 180 mg by mouth daily.     Marland Kitchen lisinopril (ZESTRIL) 5 MG tablet Take 1 tablet (5 mg total) by mouth daily. 180 tablet 1  . metroNIDAZOLE (METROCREAM) 0.75 % cream Apply 1 application topically 2 (two) times daily as needed (for rosacea).   5  . montelukast (SINGULAIR) 10 MG tablet Take 1 tablet (10 mg total) by mouth at bedtime. 90 tablet 3  . Multiple Vitamins-Minerals (ONE-A-DAY WOMENS 50 PLUS PO) Take 1 tablet by mouth daily.     . Omega-3 Fatty Acids (OMEGA 3 PO) Take by mouth.    Marland Kitchen  Probiotic Product (PROBIOTIC DAILY PO) Take 1 capsule by mouth daily.     Marland Kitchen triamcinolone cream (KENALOG) 0.1 % Apply 1 application topically 2 (two) times daily as needed (for eczema/psoriasis). 30 g 1  . VENTOLIN HFA 108 (90 Base) MCG/ACT inhaler Inhale 2 puffs into the lungs every 6 (six) hours as needed for wheezing or shortness of breath.     . zolpidem (AMBIEN) 5 MG tablet Take 1 tablet (5 mg total) by mouth at bedtime as needed for sleep. 90 tablet 1  . pantoprazole (PROTONIX) 40 MG tablet Take 1 tablet (40 mg total) by mouth 2 (two) times daily for 7 days. 90 tablet 3   No current facility-administered medications for this visit.    Allergies  Allergen Reactions  . Azithromycin Rash  . Doxycycline Rash  . Levofloxacin Rash  . Penicillins Rash      Discussed warning signs or symptoms. Please see discharge instructions. Patient expresses understanding.

## 2019-03-16 ENCOUNTER — Ambulatory Visit (INDEPENDENT_AMBULATORY_CARE_PROVIDER_SITE_OTHER): Payer: Medicare Other

## 2019-03-16 ENCOUNTER — Telehealth: Payer: Self-pay | Admitting: Family Medicine

## 2019-03-16 ENCOUNTER — Other Ambulatory Visit: Payer: Self-pay

## 2019-03-16 DIAGNOSIS — Z1231 Encounter for screening mammogram for malignant neoplasm of breast: Secondary | ICD-10-CM

## 2019-03-16 DIAGNOSIS — M85851 Other specified disorders of bone density and structure, right thigh: Secondary | ICD-10-CM | POA: Diagnosis not present

## 2019-03-16 DIAGNOSIS — Z780 Asymptomatic menopausal state: Secondary | ICD-10-CM

## 2019-03-16 NOTE — Telephone Encounter (Signed)
, °

## 2019-03-17 NOTE — Progress Notes (Signed)
Normal mammogram

## 2019-04-20 ENCOUNTER — Ambulatory Visit: Payer: Medicare Other

## 2019-04-26 DIAGNOSIS — Z23 Encounter for immunization: Secondary | ICD-10-CM | POA: Diagnosis not present

## 2019-04-29 DIAGNOSIS — W1843XA Slipping, tripping and stumbling without falling due to stepping from one level to another, initial encounter: Secondary | ICD-10-CM | POA: Diagnosis not present

## 2019-04-29 DIAGNOSIS — M79671 Pain in right foot: Secondary | ICD-10-CM | POA: Diagnosis not present

## 2019-04-29 DIAGNOSIS — M25571 Pain in right ankle and joints of right foot: Secondary | ICD-10-CM | POA: Diagnosis not present

## 2019-05-13 ENCOUNTER — Other Ambulatory Visit: Payer: Self-pay | Admitting: Physician Assistant

## 2019-05-13 DIAGNOSIS — K219 Gastro-esophageal reflux disease without esophagitis: Secondary | ICD-10-CM

## 2019-05-23 ENCOUNTER — Other Ambulatory Visit: Payer: Self-pay | Admitting: Physician Assistant

## 2019-05-23 DIAGNOSIS — M545 Low back pain, unspecified: Secondary | ICD-10-CM

## 2019-05-31 ENCOUNTER — Encounter: Payer: Self-pay | Admitting: Physician Assistant

## 2019-05-31 DIAGNOSIS — M545 Low back pain, unspecified: Secondary | ICD-10-CM

## 2019-06-01 MED ORDER — CELECOXIB 200 MG PO CAPS: 200.0000 mg | ORAL_CAPSULE | Freq: Two times a day (BID) | ORAL | 1 refills | Status: DC

## 2019-07-06 ENCOUNTER — Ambulatory Visit (INDEPENDENT_AMBULATORY_CARE_PROVIDER_SITE_OTHER): Payer: Medicare Other | Admitting: Physician Assistant

## 2019-07-06 VITALS — Temp 98.4°F | Ht 66.0 in | Wt 158.0 lb

## 2019-07-06 DIAGNOSIS — J320 Chronic maxillary sinusitis: Secondary | ICD-10-CM

## 2019-07-06 MED ORDER — CEFDINIR 300 MG PO CAPS: 300.0000 mg | ORAL_CAPSULE | Freq: Two times a day (BID) | ORAL | 0 refills | Status: DC

## 2019-07-06 NOTE — Progress Notes (Signed)
Patient ID: Monica Page, female   DOB: 29-Oct-1950, 68 y.o.   MRN: [id number] .Marland KitchenVirtual Visit via Video Note  I connected with Eloina Sawinski Rosenberger on 07/06/2019 at  3:20 PM EST by a video enabled telemedicine application and verified that I am speaking with the correct person using two identifiers.  Location: Patient: home Provider: clinic   I discussed the limitations of evaluation and management by telemedicine and the availability of in person appointments. The patient expressed understanding and agreed to proceed.  History of Present Illness: Pt is a 68 yo female who presents to the clinic with URI symptoms that started 2-3 weeks ago. She was having sinus drainage and congestion. She has been taking allergra, singulair, nasacort with no relief. She has a lot of sinus pressure on left side. She can't hear out of left hear. No ST, cough, body aches, fever, chills, cough, SOB, change in taste and smell. No direct covid contacts or sick contacts.   .. Active Ambulatory Problems    Diagnosis Date Noted  . Bladder cancer ([place]) 03/20/2017  . Hematuria 03/20/2017  . 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 Iron deficiency anemia 03/20/2017  . Postmenopausal 03/20/2017  . Elevated TSH 03/20/2017  . No energy 03/20/2017  . Moderate major depression ([place]) 03/20/2017  . Mild intermittent asthma without complication [id number]  . Eczema 03/20/2017  . Gastroesophageal reflux disease 03/20/2017  . Normocytic anemia 03/22/2017  . Pain of left thumb 03/23/2017  . Degenerative arthritis of left knee 05/29/2017  . 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 Elevated fasting glucose 06/16/2017  . 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 Elevated liver enzymes 06/16/2017  . Serum albumin decreased 06/16/2017  . Hair loss 12/18/2017  . Leg length discrepancy 12/18/2017  . Acute bilateral low back pain without sciatica 12/18/2017  . Elevated blood pressure  reading 12/18/2017  . Essential hypertension 01/22/2018  . Sleep apnea 05/03/2018  . Elevated LDL cholesterol level 06/01/2018  . History of bladder cancer 09/24/2011  . History of Barrett's esophagus 10/07/2014  . Hemorrhoids, internal 03/30/2015  . Gastroesophageal reflux disease with esophagitis 01/17/2015  . 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 Dry eye syndrome 11/02/2013  . 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 Nuclear sclerotic cataract of right eye 11/02/2013  . Pseudophakia, left eye 11/02/2013  . Rosacea 04/07/2012  . Left hip pain 11/23/2018  . Acute lower gastrointestinal bleeding 01/31/2019  . Osteoporosis screening 02/15/2019  . 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 No Resolved Ambulatory Problems   Past Medical History:  Diagnosis Date  . Anemia   . Anxiety   . Arthritis   . Asthma   . Cancer ([place]) 07/28/2004  . Colitis, acute   . Complication of anesthesia   . Depression   . GERD (gastroesophageal reflux disease)    Reviewed med, allergy, problem list.    Observations/Objective: No acute distress. No labored breathing, wheezing, coughing.  Normal appearance.   .. Today's Vitals   07/06/19 1349  Temp: 98.4 F (36.9 C)  TempSrc: Oral  Weight: 158 lb (71.7 kg)  Height: 5\' 6"  (1.676 m)   Body mass index is 25.5 kg/m.    Assessment and Plan: Marland KitchenMarland KitchenShaneisha was seen today for sinus problem.  Diagnoses and all orders for this visit:  Left maxillary sinusitis -     cefdinir (OMNICEF) 300  MG capsule; Take 1 capsule (300 mg total) by mouth 2 (two) times daily.   Sick for 2-3 weeks. Symptoms not suspicious for covid she is out of window to test. Quarantine period would already be over. Send omnicef due to allergies. Continue nasacort. Rest and hydrate. Follow up as needed.    Follow Up Instructions:    I discussed the assessment and  treatment plan with the patient. The patient was provided an opportunity to ask questions and all were answered. The patient agreed with the plan and demonstrated an understanding of the instructions.   The patient was advised to call back or seek an in-person evaluation if the symptoms worsen or if the condition fails to improve as anticipated.    Iran Planas, PA-C

## 2019-07-06 NOTE — Progress Notes (Deleted)
Symptoms started a couple weeks ago Can't hear out of left ear Sinus headache Left sided congestion  No sore throat, cough, body aches, fever/chills, change in taste/smell  Has been taking Allegra, Singulair, and Nasacort with no relief

## 2019-07-08 ENCOUNTER — Encounter: Payer: Self-pay | Admitting: Physician Assistant

## 2019-07-17 ENCOUNTER — Encounter: Payer: Self-pay | Admitting: Physician Assistant

## 2019-07-20 ENCOUNTER — Encounter: Payer: Self-pay | Admitting: Physician Assistant

## 2019-07-20 ENCOUNTER — Ambulatory Visit (INDEPENDENT_AMBULATORY_CARE_PROVIDER_SITE_OTHER): Payer: Medicare Other | Admitting: Physician Assistant

## 2019-07-20 ENCOUNTER — Other Ambulatory Visit: Payer: Self-pay

## 2019-07-20 VITALS — BP 106/69 | HR 81 | Ht 66.0 in | Wt 158.0 lb

## 2019-07-20 DIAGNOSIS — H6982 Other specified disorders of Eustachian tube, left ear: Secondary | ICD-10-CM | POA: Diagnosis not present

## 2019-07-20 MED ORDER — METHYLPREDNISOLONE 4 MG PO TBPK: ORAL_TABLET | ORAL | 0 refills | Status: DC

## 2019-07-20 NOTE — Progress Notes (Signed)
Patient ID: Monica Page, female   DOB: [birth year], 68 y.o.   MRN: [id number] .Marland KitchenVirtual Visit via Video Note  I connected with Monica Page on 07/20/19 at  7:30 AM EST by a video enabled telemedicine application and verified that I am speaking with the correct person using two identifiers.  Location: Patient: home Provider: home   I discussed the limitations of evaluation and management by telemedicine and the availability of in person appointments. The patient expressed understanding and agreed to proceed.  History of Present Illness: Pt is a 68 yo female who presents to the clinic to follow up on left ear congestion and hearing loss. She was seen about 1 week ago and given omnicef for her sinus symptoms. Her sinus symptoms are better but left with left ear feeling "clogged and no hearing". No ear pain or discharge. No fever, chills, headache. She is using allegra and nasocort. No dizziness or hx of this.   .. Active Ambulatory Problems    Diagnosis Date Noted  . Bladder cancer ([place]) 03/20/2017  . Hematuria 03/20/2017  . 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 Iron deficiency anemia 03/20/2017  . Postmenopausal 03/20/2017  . Elevated TSH 03/20/2017  . No energy 03/20/2017  . 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 Mild intermittent asthma without complication [id number]  . Eczema 03/20/2017  . Gastroesophageal reflux disease 03/20/2017  . Normocytic anemia 03/22/2017  . Pain of left thumb 03/23/2017  . Degenerative arthritis of left knee 05/29/2017  . 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 Elevated fasting glucose 06/16/2017  . 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 Elevated liver enzymes 06/16/2017  . Serum albumin decreased 06/16/2017  . Hair loss 12/18/2017  . Leg length discrepancy 12/18/2017  . Acute bilateral low back pain without sciatica 12/18/2017  . Elevated blood pressure reading 12/18/2017  . Essential  hypertension 01/22/2018  . Sleep apnea 05/03/2018  . Elevated LDL cholesterol level 06/01/2018  . History of bladder cancer 09/24/2011  . History of Barrett's esophagus 10/07/2014  . Hemorrhoids, internal 03/30/2015  . Gastroesophageal reflux disease with esophagitis 01/17/2015  . 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 Dry eye syndrome 11/02/2013  . 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 Nuclear sclerotic cataract of right eye 11/02/2013  . Pseudophakia, left eye 11/02/2013  . Rosacea 04/07/2012  . Left hip pain 11/23/2018  . Acute lower gastrointestinal bleeding 01/31/2019  . Osteoporosis screening 02/15/2019  . 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 No Resolved Ambulatory Problems   Past Medical History:  Diagnosis Date  . Anemia   . Anxiety   . Arthritis   . Asthma   . Cancer ([place]) 07/28/2004  . Colitis, acute   . Complication of anesthesia   . Depression   . GERD (gastroesophageal reflux disease)    Reviewed med, allergy, problem list.     Observations/Objective: No acute distress.  Normal mood and appearance.  No pain to palpation over left ear or tragus.   .. Today's Vitals   07/20/19 0709  BP: 106/69  Pulse: 81  Weight: 158 lb (71.7 kg)  Height: 5\' 6"  (1.676 m)   Body mass index is 25.5 kg/m.    Assessment and Plan: Marland KitchenMarland KitchenDiagnoses and all orders for this visit:  ETD (Eustachian tube dysfunction), left -     methylPREDNISolone (MEDROL DOSEPAK) 4 MG TBPK tablet;  Take as directed bay package insert.   Sent prednisone. Discussed adding sudafed. Continue on nasocort and every 5 days use aftrin for 2 days. Encouraged chewing gum and sucking manuevers. Let me know how doing on Monday.    Follow Up Instructions:    I discussed the assessment and treatment plan with the patient. The patient was provided an opportunity to ask questions and all were  answered. The patient agreed with the plan and demonstrated an understanding of the instructions.   The patient was advised to call back or seek an in-person evaluation if the symptoms worsen or if the condition fails to improve as anticipated.  Iran Planas, PA-C

## 2019-07-20 NOTE — Progress Notes (Deleted)
Left ear stuffy, can't hardly hear Antibiotics didn't help ear at all Sinus issues and headache better No drainage from ear, can hear fluid moving around

## 2019-08-06 ENCOUNTER — Other Ambulatory Visit: Payer: Self-pay | Admitting: Physician Assistant

## 2019-08-06 DIAGNOSIS — J4520 Mild intermittent asthma, uncomplicated: Secondary | ICD-10-CM

## 2019-08-06 DIAGNOSIS — J302 Other seasonal allergic rhinitis: Secondary | ICD-10-CM

## 2019-08-06 DIAGNOSIS — K219 Gastro-esophageal reflux disease without esophagitis: Secondary | ICD-10-CM

## 2019-08-10 ENCOUNTER — Other Ambulatory Visit: Payer: Self-pay | Admitting: Physician Assistant

## 2019-08-10 DIAGNOSIS — F5101 Primary insomnia: Secondary | ICD-10-CM

## 2019-08-10 NOTE — Telephone Encounter (Signed)
Last filled 02/07/2019 #90 with one refill.  Last appt 07/20/2019 (ear) Last actual follow up 02/23/2019

## 2019-08-16 ENCOUNTER — Encounter: Payer: Self-pay | Admitting: Physician Assistant

## 2019-08-16 MED ORDER — ZOLPIDEM TARTRATE 5 MG PO TABS: ORAL_TABLET | ORAL | 1 refills | Status: AC

## 2019-08-16 NOTE — Telephone Encounter (Signed)
Needs resent with Dr. Gardiner Ramus DEA number. Please re-send.

## 2019-08-16 NOTE — Addendum Note (Signed)
Addended byAnnamaria Helling on: 08/16/2019 04:39 PM   Modules accepted: Orders

## 2019-08-17 ENCOUNTER — Other Ambulatory Visit: Payer: Self-pay | Admitting: Physician Assistant

## 2019-08-17 DIAGNOSIS — F321 Major depressive disorder, single episode, moderate: Secondary | ICD-10-CM

## 2019-08-26 ENCOUNTER — Ambulatory Visit: Payer: 59 | Admitting: Physician Assistant

## 2019-08-30 ENCOUNTER — Encounter: Payer: Self-pay | Admitting: Physician Assistant

## 2019-08-30 ENCOUNTER — Telehealth (INDEPENDENT_AMBULATORY_CARE_PROVIDER_SITE_OTHER): Payer: Medicare Other | Admitting: Physician Assistant

## 2019-08-30 VITALS — BP 140/79 | HR 78 | Ht 66.0 in | Wt 166.0 lb

## 2019-08-30 DIAGNOSIS — F321 Major depressive disorder, single episode, moderate: Secondary | ICD-10-CM | POA: Diagnosis not present

## 2019-08-30 DIAGNOSIS — Z20822 Contact with and (suspected) exposure to covid-19: Secondary | ICD-10-CM

## 2019-08-30 MED ORDER — BUPROPION HCL ER (XL) 300 MG PO TB24: 300.0000 mg | ORAL_TABLET | Freq: Every day | ORAL | 1 refills | Status: DC

## 2019-08-30 MED ORDER — DULOXETINE HCL 60 MG PO CPEP: 60.0000 mg | ORAL_CAPSULE | Freq: Two times a day (BID) | ORAL | 1 refills | Status: DC

## 2019-08-30 NOTE — Progress Notes (Signed)
Patient ID: Monica Page, female   DOB: March 25, 1951, 69 y.o.   MRN: [id number] .Marland KitchenVirtual Visit via Video Note  I connected with Monica Page on 08/30/19 at  3:20 PM EST by a video enabled telemedicine application and verified that I am speaking with the correct person using two identifiers.  Location: Patient: home Provider: clinic   I discussed the limitations of evaluation and management by telemedicine and the availability of in person appointments. The patient expressed understanding and agreed to proceed.  History of Present Illness: Pt is a 69 yo female who calls into the clinic with covid concerns. Her neighbor tested positive for covid today. She last had contact with her on 08/16/2019. Last week patient had ST and loose stools for 2 days. No other symptoms. She wonders if she needs to be tested. No current symptoms.   Pt does need cymbalta and wellbutrin refilled. She is doing ok. Some days better than others. She tries to talk on phone to friend at least once a day. When warmer she would go for walks. No SI/HC.   Active Ambulatory Problems    Diagnosis Date Noted  . Bladder cancer ([place]) 03/20/2017  . Hematuria 03/20/2017  . 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 Iron deficiency anemia 03/20/2017  . Postmenopausal 03/20/2017  . Elevated TSH 03/20/2017  . No energy 03/20/2017  . 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 Mild intermittent asthma without complication Q000111Q  . Eczema 03/20/2017  . Gastroesophageal reflux disease 03/20/2017  . Normocytic anemia 03/22/2017  . Pain of left thumb 03/23/2017  . Degenerative arthritis of left knee 05/29/2017  . 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 Elevated fasting glucose 06/16/2017  . 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 Elevated liver enzymes 06/16/2017  . Serum albumin decreased 06/16/2017  . Hair loss 12/18/2017  . Leg length discrepancy 12/18/2017  . Acute  bilateral low back pain without sciatica 12/18/2017  . Elevated blood pressure reading 12/18/2017  . Essential hypertension 01/22/2018  . Sleep apnea 05/03/2018  . Elevated LDL cholesterol level 06/01/2018  . History of bladder cancer 09/24/2011  . History of Barrett's esophagus 10/07/2014  . Hemorrhoids, internal 03/30/2015  . Gastroesophageal reflux disease with esophagitis 01/17/2015  . 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 Dry eye syndrome 11/02/2013  . 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 Nuclear sclerotic cataract of right eye 11/02/2013  . Pseudophakia, left eye 11/02/2013  . Rosacea 04/07/2012  . Left hip pain 11/23/2018  . Acute lower gastrointestinal bleeding 01/31/2019  . Osteoporosis screening 02/15/2019  . 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 Close exposure to COVID-19 virus 08/30/2019   Resolved Ambulatory Problems    Diagnosis Date Noted  . No Resolved Ambulatory Problems   Past Medical History:  Diagnosis Date  . Anemia   . Anxiety   . Arthritis   . Asthma   . Cancer ([place]) 07/28/2004  . Colitis, acute   . Complication of anesthesia   . Depression   . GERD (gastroesophageal reflux disease)    Reviewed med, allergy, problem list.     Observations/Objective: No acute distress. Normal mood and appearance.   .. Today's Vitals   08/30/19 1239  BP: 140/79  Pulse: 78  Weight: 166 lb (75.3 kg)  Height: 5\' 6"  (1.676 m)   Body mass index is 26.79 kg/m.    Assessment and Plan: Marland KitchenMarland KitchenCallie was seen today for advice  only.  Diagnoses and all orders for this visit:  Close exposure to COVID-19 virus  Moderate major depression (H[facility]) -     buPROPion (WELLBUTRIN XL) 300 MG 24 hr tablet; Take 1 tablet (300 mg total) by mouth daily. -     DULoxetine (CYMBALTA) 60 MG capsule; Take 1 capsule (60 mg total) by mouth 2 (two) times daily.   She has not had contact with neighbor since 1/19 and neighbor  tested positive 08/30/19 and going back 5-7 days would still not be in window for contact. Pt currently has no symptoms. No need for testing. Pt on wait list for covid vaccine.   Refilled mood medications. Discussed tips to help get through winter and pandemic. Follow up as needed.    Follow Up Instructions:    I discussed the assessment and treatment plan with the patient. The patient was provided an opportunity to ask questions and all were answered. The patient agreed with the plan and demonstrated an understanding of the instructions.   The patient was advised to call back or seek an in-person evaluation if the symptoms worsen or if the condition fails to improve as anticipated.    Iran Planas, PA-C

## 2019-08-30 NOTE — Progress Notes (Deleted)
No symptoms  Next door neighbor diagnosed with Covid (Last night) Last week she had sore throat two days/GI symptoms Last around neighbor on 08/16/2019 - in car together

## 2019-09-10 ENCOUNTER — Ambulatory Visit: Payer: Medicare Other

## 2019-09-11 ENCOUNTER — Ambulatory Visit: Payer: Medicare Other

## 2019-09-25 ENCOUNTER — Ambulatory Visit: Payer: Medicare Other

## 2019-09-29 ENCOUNTER — Ambulatory Visit: Payer: Medicare Other

## 2019-10-02 ENCOUNTER — Ambulatory Visit: Payer: Medicare Other

## 2019-10-05 DIAGNOSIS — Z23 Encounter for immunization: Secondary | ICD-10-CM | POA: Diagnosis not present

## 2019-10-18 ENCOUNTER — Encounter: Payer: Self-pay | Admitting: Physician Assistant

## 2019-10-19 ENCOUNTER — Telehealth: Payer: Self-pay
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
## 2019-10-19 NOTE — Telephone Encounter (Signed)
Pt scheduled with Dr. Darene Lamer on 10/24/2019 at Angelina Pih, CMA

## 2019-10-19 NOTE — Telephone Encounter (Signed)
Schedule with T for hip injection due to hip OA

## 2019-10-24 ENCOUNTER — Other Ambulatory Visit: Payer: Self-pay

## 2019-10-24 ENCOUNTER — Ambulatory Visit (INDEPENDENT_AMBULATORY_CARE_PROVIDER_SITE_OTHER): Payer: Medicare Other | Admitting: Sports Medicine

## 2019-10-24 DIAGNOSIS — M1612 Unilateral primary osteoarthritis, left hip: Secondary | ICD-10-CM | POA: Diagnosis not present

## 2019-10-24 MED ORDER — ACETAMINOPHEN ER 650 MG PO TBCR: 650.0000 mg | EXTENDED_RELEASE_TABLET | Freq: Three times a day (TID) | ORAL | 3 refills | Status: AC | PRN

## 2019-10-24 MED ORDER — HYDROCODONE-ACETAMINOPHEN 5-325 MG PO TABS: 1.0000 | ORAL_TABLET | Freq: Three times a day (TID) | ORAL | 0 refills | Status: DC | PRN

## 2019-10-24 NOTE — Progress Notes (Signed)
    Procedures performed today:    None.  Independent interpretation of notes and tests performed by another provider:   None.  Brief History, Exam, Impression, and Recommendations:    Primary osteoarthritis of left hip This is a pleasant 69 year old female with hip osteoarthritis, she does need an arthroplasty. She is waiting for Covid to settle down before this happens, for this reason we have been doing occasional hip joint injections, she has her second COVID-19 vaccine on the 31st of the month so we will need to hold off on her injection for now, I am adding arthritis from Tylenol, continue Celebrex, also adding hydrocodone for breakthrough pain. Return to see me at the end of next month and we will do a hip joint injection.    ___________________________________________ Gwen Her. Dianah Field, M.D., ABFM., CAQSM. Primary Care and [place] Instructor of [place] [employer] [place]

## 2019-10-24 NOTE — Assessment & Plan Note (Signed)
This is a pleasant 69 year old female with hip osteoarthritis, she does need an arthroplasty. She is waiting for Covid to settle down before this happens, for this reason we have been doing occasional hip joint injections, she has her second COVID-19 vaccine on the 31st of the month so we will need to hold off on her injection for now, I am adding arthritis from Tylenol, continue Celebrex, also adding hydrocodone for breakthrough pain. Return to see me at the end of next month and we will do a hip joint injection.

## 2019-10-26 DIAGNOSIS — Z23 Encounter for immunization: Secondary | ICD-10-CM | POA: Diagnosis not present

## 2019-10-28 ENCOUNTER — Other Ambulatory Visit: Payer: Self-pay

## 2019-10-28 DIAGNOSIS — M1612 Unilateral primary osteoarthritis, left hip: Secondary | ICD-10-CM

## 2019-10-31 ENCOUNTER — Other Ambulatory Visit: Payer: Self-pay | Admitting: Physician Assistant

## 2019-10-31 ENCOUNTER — Other Ambulatory Visit: Payer: Self-pay | Admitting: *Deleted

## 2019-10-31 DIAGNOSIS — K219 Gastro-esophageal reflux disease without esophagitis: Secondary | ICD-10-CM

## 2019-10-31 DIAGNOSIS — M1612 Unilateral primary osteoarthritis, left hip: Secondary | ICD-10-CM

## 2019-10-31 MED ORDER — HYDROCODONE-ACETAMINOPHEN 10-325 MG PO TABS: 1.0000 | ORAL_TABLET | Freq: Three times a day (TID) | ORAL | 0 refills | Status: DC | PRN

## 2019-11-05 ENCOUNTER — Other Ambulatory Visit: Payer: Self-pay | Admitting: Physician Assistant

## 2019-11-05 DIAGNOSIS — M545 Low back pain, unspecified: Secondary | ICD-10-CM

## 2019-11-09 ENCOUNTER — Other Ambulatory Visit: Payer: Self-pay

## 2019-11-10 MED ORDER — HYDROCODONE-ACETAMINOPHEN 10-325 MG PO TABS: 1.0000 | ORAL_TABLET | Freq: Three times a day (TID) | ORAL | 0 refills | Status: DC | PRN

## 2019-11-10 NOTE — Telephone Encounter (Signed)
Routing to provider  

## 2019-11-18 ENCOUNTER — Other Ambulatory Visit: Payer: Self-pay

## 2019-11-18 ENCOUNTER — Other Ambulatory Visit: Payer: Self-pay | Admitting: Physician Assistant

## 2019-11-18 DIAGNOSIS — K219 Gastro-esophageal reflux disease without esophagitis: Secondary | ICD-10-CM

## 2019-11-18 MED ORDER — HYDROCODONE-ACETAMINOPHEN 10-325 MG PO TABS: 1.0000 | ORAL_TABLET | Freq: Three times a day (TID) | ORAL | 0 refills | Status: DC | PRN

## 2019-11-24 ENCOUNTER — Other Ambulatory Visit: Payer: Self-pay

## 2019-11-24 ENCOUNTER — Ambulatory Visit (INDEPENDENT_AMBULATORY_CARE_PROVIDER_SITE_OTHER): Payer: Medicare Other | Admitting: Sports Medicine

## 2019-11-24 ENCOUNTER — Encounter: Payer: Self-pay | Admitting: Sports Medicine

## 2019-11-24 DIAGNOSIS — M1612 Unilateral primary osteoarthritis, left hip: Secondary | ICD-10-CM | POA: Diagnosis not present

## 2019-11-24 DIAGNOSIS — M2170 Unequal limb length (acquired), unspecified site: Secondary | ICD-10-CM

## 2019-11-24 MED ORDER — OXYCODONE-ACETAMINOPHEN 10-325 MG PO TABS: 1.0000 | ORAL_TABLET | Freq: Three times a day (TID) | ORAL | 0 refills | Status: AC | PRN

## 2019-11-24 MED ORDER — DICLOFENAC SODIUM 1 % EX GEL: 4.0000 g | Freq: Four times a day (QID) | CUTANEOUS | 11 refills | Status: AC

## 2019-11-24 NOTE — Addendum Note (Signed)
Addended by: Silverio Decamp on: 11/24/2019 10:56 AM   Modules accepted: Orders

## 2019-11-24 NOTE — Assessment & Plan Note (Addendum)
Monica Page returns, she is a pleasant 69 year old female with left hip osteoarthritis, this is end-stage and she certainly needs an arthroplasty. She had not yet been ready for arthroplasty. She is in severe pain today, and for this reason we did an injection per her request, I am also going to switch her to high-dose Percocet, continue Celebrex. I am going to go ahead and refer her to Dr. Berenice Primas to at least start the arthroplasty process.

## 2019-11-24 NOTE — Progress Notes (Addendum)
    Procedures performed today:    Procedure: Real-time Ultrasound Guided injection of the left hip joint Device: Samsung HS60  Verbal informed consent obtained.  Time-out conducted.  Noted no overlying erythema, induration, or other signs of local infection.  Skin prepped in a sterile fashion.  Local anesthesia: Topical Ethyl chloride.  With sterile technique and under real time ultrasound guidance: 1 cc Kenalog 40, 2 cc lidocaine, 2 cc bupivacaine injected easily Completed without difficulty  Pain immediately resolved suggesting accurate placement of the medication.  Advised to call if fevers/chills, erythema, induration, drainage, or persistent bleeding.  Images permanently stored and available for review in the ultrasound unit.  Impression: Technically successful ultrasound guided injection.  Independent interpretation of notes and tests performed by another provider:   None.  Brief History, Exam, Impression, and Recommendations:    Primary osteoarthritis of left hip Monica Page returns, she is a pleasant 69 year old female with left hip osteoarthritis, this is end-stage and she certainly needs an arthroplasty. She had not yet been ready for arthroplasty. She is in severe pain today, and for this reason we did an injection per her request, I am also going to switch her to high-dose Percocet, continue Celebrex. I am going to go ahead and refer her to Dr. Berenice Primas to at least start the arthroplasty process.  Leg length discrepancy Monica Page also has a leg length discrepancy, left longer than right, she is post revision arthroplasty on the right. I am adding a heel lift for her right shoe.    ___________________________________________ Gwen Her. Dianah Field, M.D., ABFM., CAQSM. Primary Care and [place] Instructor of [place] [employer] [facility]

## 2019-11-24 NOTE — Assessment & Plan Note (Addendum)
Monica Page also has a leg length discrepancy, left longer than right, she is post revision arthroplasty on the right. I am adding a heel lift for her right shoe.

## 2019-11-25 ENCOUNTER — Ambulatory Visit: Payer: Medicare Other | Admitting: Sports Medicine

## 2019-12-21 DIAGNOSIS — L03019 Cellulitis of unspecified finger: Secondary | ICD-10-CM | POA: Diagnosis not present

## 2019-12-21 DIAGNOSIS — M1612 Unilateral primary osteoarthritis, left hip: Secondary | ICD-10-CM | POA: Diagnosis not present

## 2019-12-21 DIAGNOSIS — E663 Overweight: Secondary | ICD-10-CM | POA: Diagnosis not present

## 2019-12-21 DIAGNOSIS — K219 Gastro-esophageal reflux disease without esophagitis: Secondary | ICD-10-CM | POA: Diagnosis not present

## 2019-12-21 DIAGNOSIS — Z6826 Body mass index (BMI) 26.0-26.9, adult: Secondary | ICD-10-CM | POA: Diagnosis not present

## 2019-12-22 ENCOUNTER — Ambulatory Visit: Payer: Medicare Other | Admitting: Sports Medicine

## 2019-12-22 ENCOUNTER — Ambulatory Visit: Payer: Medicare Other | Admitting: Family Medicine

## 2019-12-27 DIAGNOSIS — Z79891 Long term (current) use of opiate analgesic: Secondary | ICD-10-CM | POA: Diagnosis not present

## 2019-12-27 DIAGNOSIS — L03119 Cellulitis of unspecified part of limb: Secondary | ICD-10-CM | POA: Diagnosis not present

## 2019-12-27 DIAGNOSIS — Z6825 Body mass index (BMI) 25.0-25.9, adult: Secondary | ICD-10-CM | POA: Diagnosis not present

## 2019-12-27 DIAGNOSIS — E663 Overweight: Secondary | ICD-10-CM | POA: Diagnosis not present

## 2020-01-02 DIAGNOSIS — M79604 Pain in right leg: Secondary | ICD-10-CM | POA: Diagnosis not present

## 2020-01-02 DIAGNOSIS — E663 Overweight: Secondary | ICD-10-CM | POA: Diagnosis not present

## 2020-01-02 DIAGNOSIS — R21 Rash and other nonspecific skin eruption: Secondary | ICD-10-CM | POA: Diagnosis not present

## 2020-01-02 DIAGNOSIS — M1612 Unilateral primary osteoarthritis, left hip: Secondary | ICD-10-CM | POA: Diagnosis not present

## 2020-01-02 DIAGNOSIS — Z6825 Body mass index (BMI) 25.0-25.9, adult: Secondary | ICD-10-CM | POA: Diagnosis not present

## 2020-01-05 DIAGNOSIS — M87052 Idiopathic aseptic necrosis of left femur: Secondary | ICD-10-CM | POA: Diagnosis not present

## 2020-01-05 DIAGNOSIS — M79604 Pain in right leg: Secondary | ICD-10-CM | POA: Diagnosis not present

## 2020-01-05 DIAGNOSIS — M25552 Pain in left hip: Secondary | ICD-10-CM | POA: Diagnosis not present

## 2020-01-05 DIAGNOSIS — M9721XA Periprosthetic fracture around internal prosthetic right ankle joint, initial encounter: Secondary | ICD-10-CM | POA: Diagnosis not present

## 2020-01-05 DIAGNOSIS — R262 Difficulty in walking, not elsewhere classified: Secondary | ICD-10-CM | POA: Diagnosis not present

## 2020-01-06 ENCOUNTER — Telehealth: Payer: Self-pay | Admitting: Physician Assistant

## 2020-01-06 DIAGNOSIS — M87052 Idiopathic aseptic necrosis of left femur: Secondary | ICD-10-CM | POA: Diagnosis not present

## 2020-01-06 DIAGNOSIS — S82201D Unspecified fracture of shaft of right tibia, subsequent encounter for closed fracture with routine healing: Secondary | ICD-10-CM | POA: Diagnosis not present

## 2020-01-06 DIAGNOSIS — M9711XA Periprosthetic fracture around internal prosthetic right knee joint, initial encounter: Secondary | ICD-10-CM | POA: Diagnosis not present

## 2020-01-06 DIAGNOSIS — Z9889 Other specified postprocedural states: Secondary | ICD-10-CM | POA: Diagnosis not present

## 2020-01-06 DIAGNOSIS — S82241A Displaced spiral fracture of shaft of right tibia, initial encounter for closed fracture: Secondary | ICD-10-CM | POA: Diagnosis not present

## 2020-01-06 DIAGNOSIS — S82221A Displaced transverse fracture of shaft of right tibia, initial encounter for closed fracture: Secondary | ICD-10-CM | POA: Diagnosis not present

## 2020-01-06 NOTE — Telephone Encounter (Signed)
Patient asked for records to be sent down to [facility]. I called the office to figure out what records need to be sent. They let me know she hasnt been seen yet and if they need any records they will send a request.   Left a VM letting the PT know about the situation.  [facility] Phone: [phone] Fax: [phone]  Left Hip, Orthopedic/Surgery Dr. Darcus Pester.

## 2020-01-24 DIAGNOSIS — S82201D Unspecified fracture of shaft of right tibia, subsequent encounter for closed fracture with routine healing: Secondary | ICD-10-CM | POA: Diagnosis not present

## 2020-01-24 DIAGNOSIS — M1612 Unilateral primary osteoarthritis, left hip: Secondary | ICD-10-CM | POA: Diagnosis not present

## 2020-01-24 DIAGNOSIS — S82221A Displaced transverse fracture of shaft of right tibia, initial encounter for closed fracture: Secondary | ICD-10-CM | POA: Diagnosis not present

## 2020-01-24 DIAGNOSIS — S82241A Displaced spiral fracture of shaft of right tibia, initial encounter for closed fracture: Secondary | ICD-10-CM | POA: Diagnosis not present

## 2020-01-24 DIAGNOSIS — Z96651 Presence of right artificial knee joint: Secondary | ICD-10-CM | POA: Diagnosis not present

## 2020-01-24 DIAGNOSIS — S82201A Unspecified fracture of shaft of right tibia, initial encounter for closed fracture: Secondary | ICD-10-CM | POA: Diagnosis not present

## 2020-01-26 DIAGNOSIS — Z9181 History of falling: Secondary | ICD-10-CM | POA: Diagnosis not present

## 2020-01-26 DIAGNOSIS — F419 Anxiety disorder, unspecified: Secondary | ICD-10-CM | POA: Diagnosis not present

## 2020-01-26 DIAGNOSIS — M2550 Pain in unspecified joint: Secondary | ICD-10-CM | POA: Diagnosis not present

## 2020-01-26 DIAGNOSIS — Z96651 Presence of right artificial knee joint: Secondary | ICD-10-CM | POA: Diagnosis not present

## 2020-01-26 DIAGNOSIS — S82201D Unspecified fracture of shaft of right tibia, subsequent encounter for closed fracture with routine healing: Secondary | ICD-10-CM | POA: Diagnosis not present

## 2020-01-26 DIAGNOSIS — R2681 Unsteadiness on feet: Secondary | ICD-10-CM | POA: Diagnosis not present

## 2020-01-26 DIAGNOSIS — R278 Other lack of coordination: Secondary | ICD-10-CM | POA: Diagnosis not present

## 2020-01-26 DIAGNOSIS — R293 Abnormal posture: Secondary | ICD-10-CM | POA: Diagnosis not present

## 2020-02-01 ENCOUNTER — Other Ambulatory Visit: Payer: Self-pay | Admitting: Physician Assistant

## 2020-02-01 DIAGNOSIS — F321 Major depressive disorder, single episode, moderate: Secondary | ICD-10-CM

## 2020-02-09 DIAGNOSIS — Z8551 Personal history of malignant neoplasm of bladder: Secondary | ICD-10-CM | POA: Diagnosis not present

## 2020-02-09 DIAGNOSIS — S82201A Unspecified fracture of shaft of right tibia, initial encounter for closed fracture: Secondary | ICD-10-CM | POA: Diagnosis not present

## 2020-02-09 DIAGNOSIS — R278 Other lack of coordination: Secondary | ICD-10-CM | POA: Diagnosis not present

## 2020-02-09 DIAGNOSIS — S82201D Unspecified fracture of shaft of right tibia, subsequent encounter for closed fracture with routine healing: Secondary | ICD-10-CM | POA: Diagnosis not present

## 2020-02-09 DIAGNOSIS — F3289 Other specified depressive episodes: Secondary | ICD-10-CM | POA: Diagnosis not present

## 2020-02-09 DIAGNOSIS — E785 Hyperlipidemia, unspecified: Secondary | ICD-10-CM | POA: Diagnosis not present

## 2020-02-09 DIAGNOSIS — D509 Iron deficiency anemia, unspecified: Secondary | ICD-10-CM | POA: Diagnosis not present

## 2020-02-09 DIAGNOSIS — G4733 Obstructive sleep apnea (adult) (pediatric): Secondary | ICD-10-CM | POA: Diagnosis not present

## 2020-02-09 DIAGNOSIS — I10 Essential (primary) hypertension: Secondary | ICD-10-CM | POA: Diagnosis not present

## 2020-02-09 DIAGNOSIS — M6281 Muscle weakness (generalized): Secondary | ICD-10-CM | POA: Diagnosis not present

## 2020-02-09 DIAGNOSIS — R296 Repeated falls: Secondary | ICD-10-CM | POA: Diagnosis not present

## 2020-02-09 DIAGNOSIS — K219 Gastro-esophageal reflux disease without esophagitis: Secondary | ICD-10-CM | POA: Diagnosis not present

## 2020-02-10 DIAGNOSIS — D649 Anemia, unspecified: Secondary | ICD-10-CM | POA: Diagnosis not present

## 2020-02-10 DIAGNOSIS — R278 Other lack of coordination: Secondary | ICD-10-CM | POA: Diagnosis not present

## 2020-02-10 DIAGNOSIS — S82201D Unspecified fracture of shaft of right tibia, subsequent encounter for closed fracture with routine healing: Secondary | ICD-10-CM | POA: Diagnosis not present

## 2020-02-10 DIAGNOSIS — I10 Essential (primary) hypertension: Secondary | ICD-10-CM | POA: Diagnosis not present

## 2020-02-10 DIAGNOSIS — Z13228 Encounter for screening for other metabolic disorders: Secondary | ICD-10-CM | POA: Diagnosis not present

## 2020-02-10 DIAGNOSIS — E559 Vitamin D deficiency, unspecified: Secondary | ICD-10-CM | POA: Diagnosis not present

## 2020-02-10 DIAGNOSIS — F3289 Other specified depressive episodes: Secondary | ICD-10-CM | POA: Diagnosis not present

## 2020-02-10 DIAGNOSIS — M6281 Muscle weakness (generalized): Secondary | ICD-10-CM | POA: Diagnosis not present

## 2020-02-13 DIAGNOSIS — R278 Other lack of coordination: Secondary | ICD-10-CM | POA: Diagnosis not present

## 2020-02-13 DIAGNOSIS — M6281 Muscle weakness (generalized): Secondary | ICD-10-CM | POA: Diagnosis not present

## 2020-02-13 DIAGNOSIS — S82201D Unspecified fracture of shaft of right tibia, subsequent encounter for closed fracture with routine healing: Secondary | ICD-10-CM | POA: Diagnosis not present

## 2020-02-13 DIAGNOSIS — F3289 Other specified depressive episodes: Secondary | ICD-10-CM | POA: Diagnosis not present

## 2020-02-14 DIAGNOSIS — S82201A Unspecified fracture of shaft of right tibia, initial encounter for closed fracture: Secondary | ICD-10-CM | POA: Diagnosis not present

## 2020-02-14 DIAGNOSIS — S82241A Displaced spiral fracture of shaft of right tibia, initial encounter for closed fracture: Secondary | ICD-10-CM | POA: Diagnosis not present

## 2020-02-14 DIAGNOSIS — M6281 Muscle weakness (generalized): Secondary | ICD-10-CM | POA: Diagnosis not present

## 2020-02-14 DIAGNOSIS — S82221A Displaced transverse fracture of shaft of right tibia, initial encounter for closed fracture: Secondary | ICD-10-CM | POA: Diagnosis not present

## 2020-02-14 DIAGNOSIS — F3289 Other specified depressive episodes: Secondary | ICD-10-CM | POA: Diagnosis not present

## 2020-02-14 DIAGNOSIS — R278 Other lack of coordination: Secondary | ICD-10-CM | POA: Diagnosis not present

## 2020-02-14 DIAGNOSIS — S82201D Unspecified fracture of shaft of right tibia, subsequent encounter for closed fracture with routine healing: Secondary | ICD-10-CM | POA: Diagnosis not present

## 2020-02-15 DIAGNOSIS — S82201D Unspecified fracture of shaft of right tibia, subsequent encounter for closed fracture with routine healing: Secondary | ICD-10-CM | POA: Diagnosis not present

## 2020-02-15 DIAGNOSIS — M79661 Pain in right lower leg: Secondary | ICD-10-CM | POA: Diagnosis not present

## 2020-02-15 DIAGNOSIS — M25552 Pain in left hip: Secondary | ICD-10-CM | POA: Diagnosis not present

## 2020-02-15 DIAGNOSIS — S82301D Unspecified fracture of lower end of right tibia, subsequent encounter for closed fracture with routine healing: Secondary | ICD-10-CM | POA: Diagnosis not present

## 2020-02-15 DIAGNOSIS — Z96651 Presence of right artificial knee joint: Secondary | ICD-10-CM | POA: Diagnosis not present

## 2020-02-15 DIAGNOSIS — R2681 Unsteadiness on feet: Secondary | ICD-10-CM | POA: Diagnosis not present

## 2020-02-15 DIAGNOSIS — R2241 Localized swelling, mass and lump, right lower limb: Secondary | ICD-10-CM | POA: Diagnosis not present

## 2020-02-15 DIAGNOSIS — R293 Abnormal posture: Secondary | ICD-10-CM | POA: Diagnosis not present

## 2020-02-15 DIAGNOSIS — F419 Anxiety disorder, unspecified: Secondary | ICD-10-CM | POA: Diagnosis not present

## 2020-02-15 DIAGNOSIS — K219 Gastro-esophageal reflux disease without esophagitis: Secondary | ICD-10-CM | POA: Diagnosis not present

## 2020-02-15 DIAGNOSIS — Z9181 History of falling: Secondary | ICD-10-CM | POA: Diagnosis not present

## 2020-02-15 DIAGNOSIS — S82201A Unspecified fracture of shaft of right tibia, initial encounter for closed fracture: Secondary | ICD-10-CM | POA: Diagnosis not present

## 2020-02-15 DIAGNOSIS — M79604 Pain in right leg: Secondary | ICD-10-CM | POA: Diagnosis not present

## 2020-02-15 DIAGNOSIS — T85698A Other mechanical complication of other specified internal prosthetic devices, implants and grafts, initial encounter: Secondary | ICD-10-CM | POA: Diagnosis not present

## 2020-02-15 DIAGNOSIS — F329 Major depressive disorder, single episode, unspecified: Secondary | ICD-10-CM | POA: Diagnosis not present

## 2020-02-15 DIAGNOSIS — R278 Other lack of coordination: Secondary | ICD-10-CM | POA: Diagnosis not present

## 2020-02-15 DIAGNOSIS — M6281 Muscle weakness (generalized): Secondary | ICD-10-CM | POA: Diagnosis not present

## 2020-02-15 DIAGNOSIS — M2550 Pain in unspecified joint: Secondary | ICD-10-CM | POA: Diagnosis not present

## 2020-02-16 DIAGNOSIS — M79604 Pain in right leg: Secondary | ICD-10-CM | POA: Diagnosis not present

## 2020-02-16 DIAGNOSIS — F329 Major depressive disorder, single episode, unspecified: Secondary | ICD-10-CM | POA: Diagnosis not present

## 2020-02-16 DIAGNOSIS — K219 Gastro-esophageal reflux disease without esophagitis: Secondary | ICD-10-CM | POA: Diagnosis not present

## 2020-02-16 DIAGNOSIS — M25552 Pain in left hip: Secondary | ICD-10-CM | POA: Diagnosis not present

## 2020-02-16 DIAGNOSIS — S82201A Unspecified fracture of shaft of right tibia, initial encounter for closed fracture: Secondary | ICD-10-CM | POA: Diagnosis not present

## 2020-02-20 DIAGNOSIS — M6281 Muscle weakness (generalized): Secondary | ICD-10-CM | POA: Diagnosis not present

## 2020-02-20 DIAGNOSIS — M79604 Pain in right leg: Secondary | ICD-10-CM | POA: Diagnosis not present

## 2020-02-25 DIAGNOSIS — I10 Essential (primary) hypertension: Secondary | ICD-10-CM | POA: Diagnosis not present

## 2020-02-25 DIAGNOSIS — Z96651 Presence of right artificial knee joint: Secondary | ICD-10-CM | POA: Diagnosis not present

## 2020-02-25 DIAGNOSIS — Z9181 History of falling: Secondary | ICD-10-CM | POA: Diagnosis not present

## 2020-02-25 DIAGNOSIS — E7849 Other hyperlipidemia: Secondary | ICD-10-CM | POA: Diagnosis not present

## 2020-02-25 DIAGNOSIS — S82201D Unspecified fracture of shaft of right tibia, subsequent encounter for closed fracture with routine healing: Secondary | ICD-10-CM | POA: Diagnosis not present

## 2020-02-25 DIAGNOSIS — G4700 Insomnia, unspecified: Secondary | ICD-10-CM | POA: Diagnosis not present

## 2020-02-25 DIAGNOSIS — Z7951 Long term (current) use of inhaled steroids: Secondary | ICD-10-CM | POA: Diagnosis not present

## 2020-02-25 DIAGNOSIS — F3289 Other specified depressive episodes: Secondary | ICD-10-CM | POA: Diagnosis not present

## 2020-02-25 DIAGNOSIS — J45909 Unspecified asthma, uncomplicated: Secondary | ICD-10-CM | POA: Diagnosis not present

## 2020-02-28 DIAGNOSIS — F3289 Other specified depressive episodes: Secondary | ICD-10-CM | POA: Diagnosis not present

## 2020-02-28 DIAGNOSIS — G4700 Insomnia, unspecified: Secondary | ICD-10-CM | POA: Diagnosis not present

## 2020-02-28 DIAGNOSIS — E7849 Other hyperlipidemia: Secondary | ICD-10-CM | POA: Diagnosis not present

## 2020-02-28 DIAGNOSIS — S82201D Unspecified fracture of shaft of right tibia, subsequent encounter for closed fracture with routine healing: Secondary | ICD-10-CM | POA: Diagnosis not present

## 2020-02-28 DIAGNOSIS — J45909 Unspecified asthma, uncomplicated: Secondary | ICD-10-CM | POA: Diagnosis not present

## 2020-02-28 DIAGNOSIS — I10 Essential (primary) hypertension: Secondary | ICD-10-CM | POA: Diagnosis not present

## 2020-02-28 IMAGING — MG DIGITAL SCREENING BILATERAL MAMMOGRAM WITH TOMO AND CAD
6 of 10 series · 6 of 30 positions shown · non-contrast
Comparison: Previous exam(s).

CLINICAL DATA: Screening.

EXAM:
DIGITAL SCREENING BILATERAL MAMMOGRAM WITH TOMO AND CAD

[R CC synth-2D]
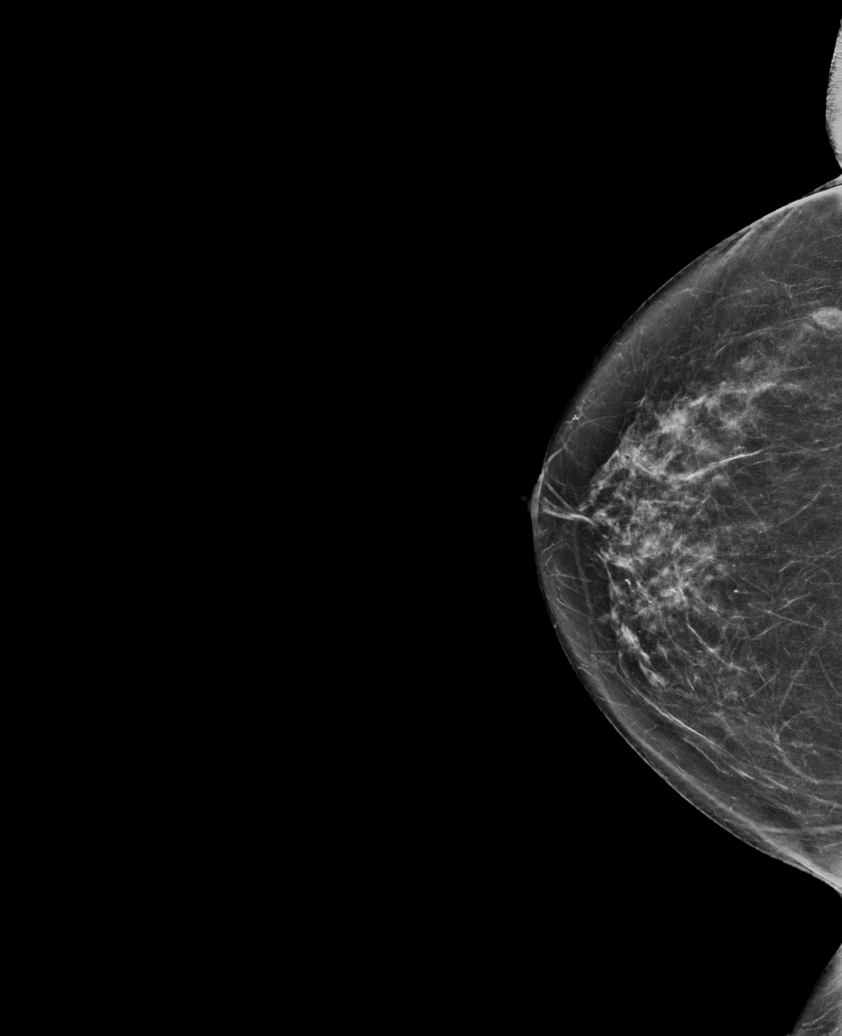

[L CC synth-2D]
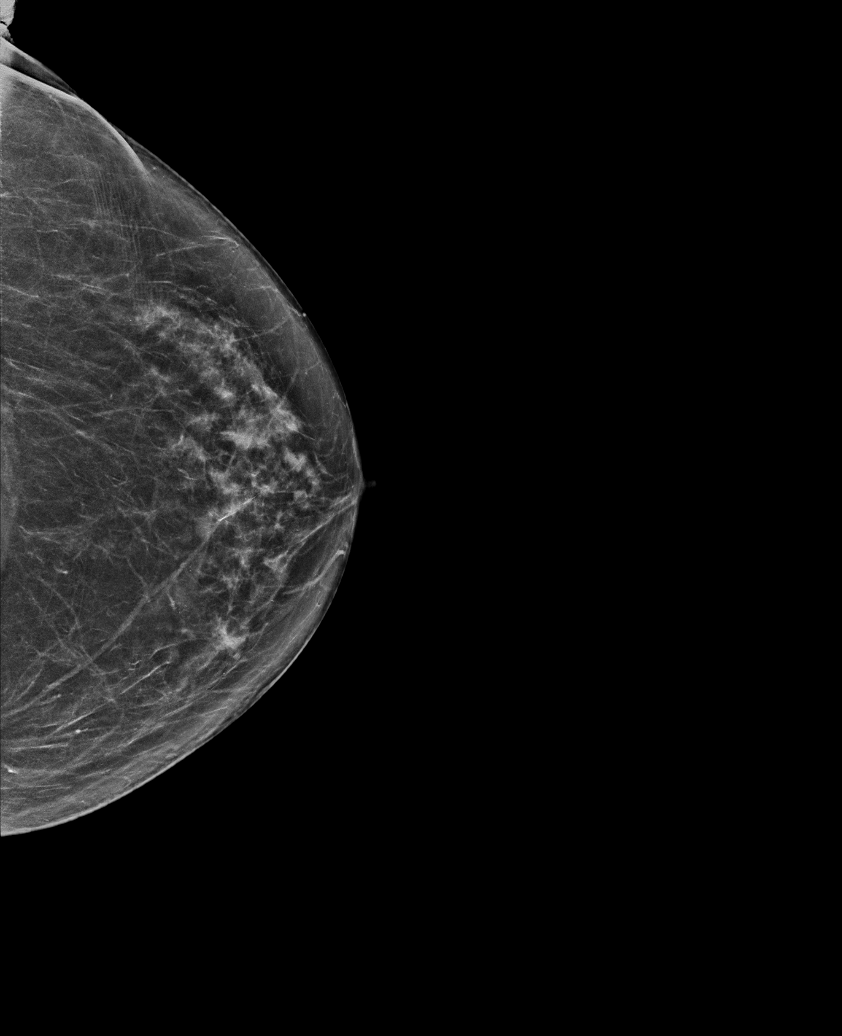

[R XCCL synth-2D]
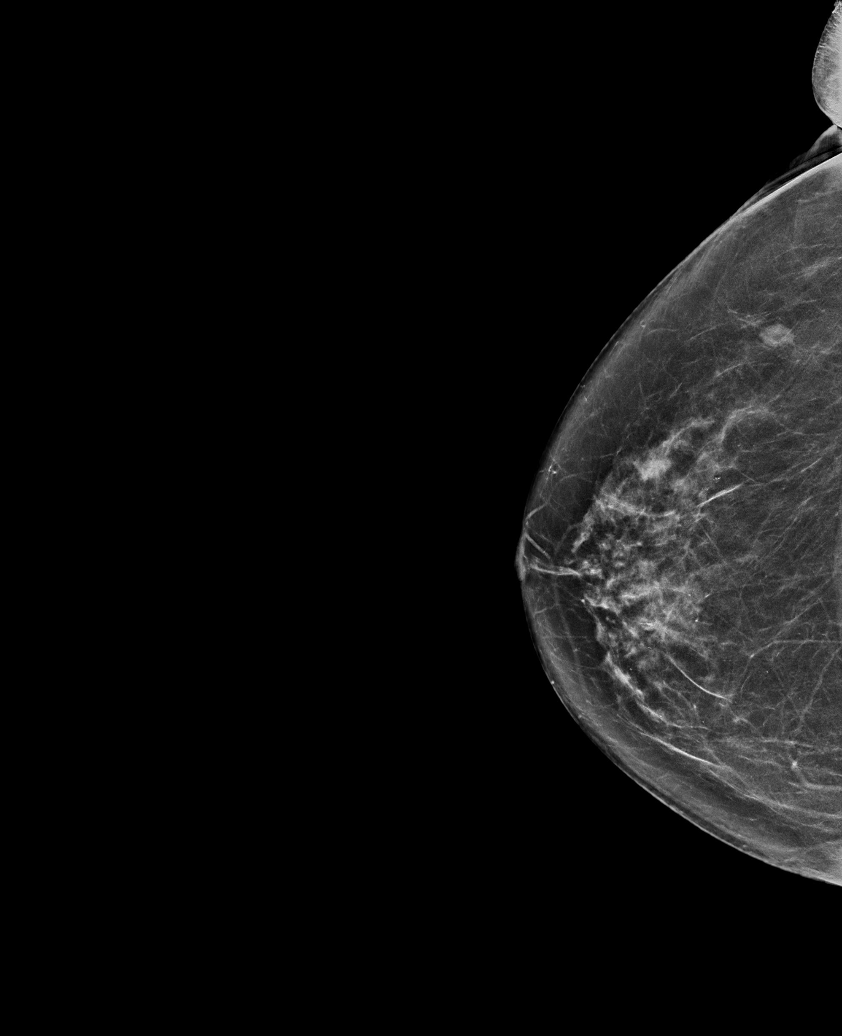

[L MLO synth-2D]
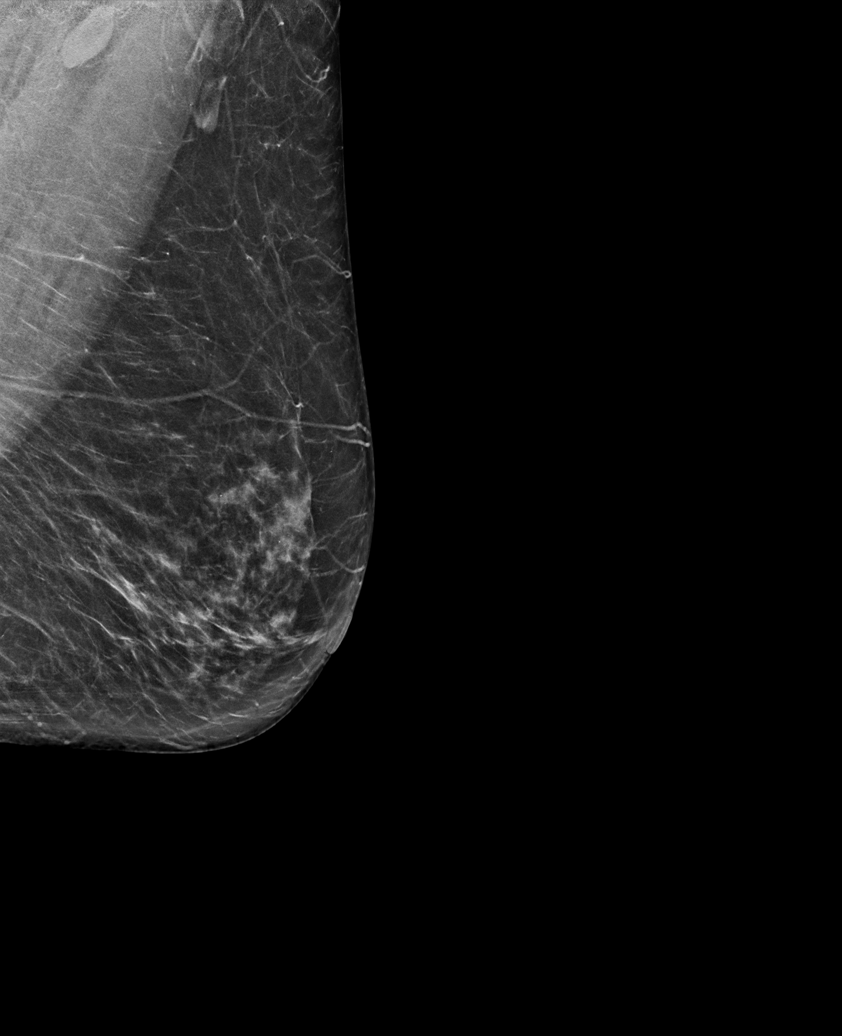

[R MLO synth-2D]
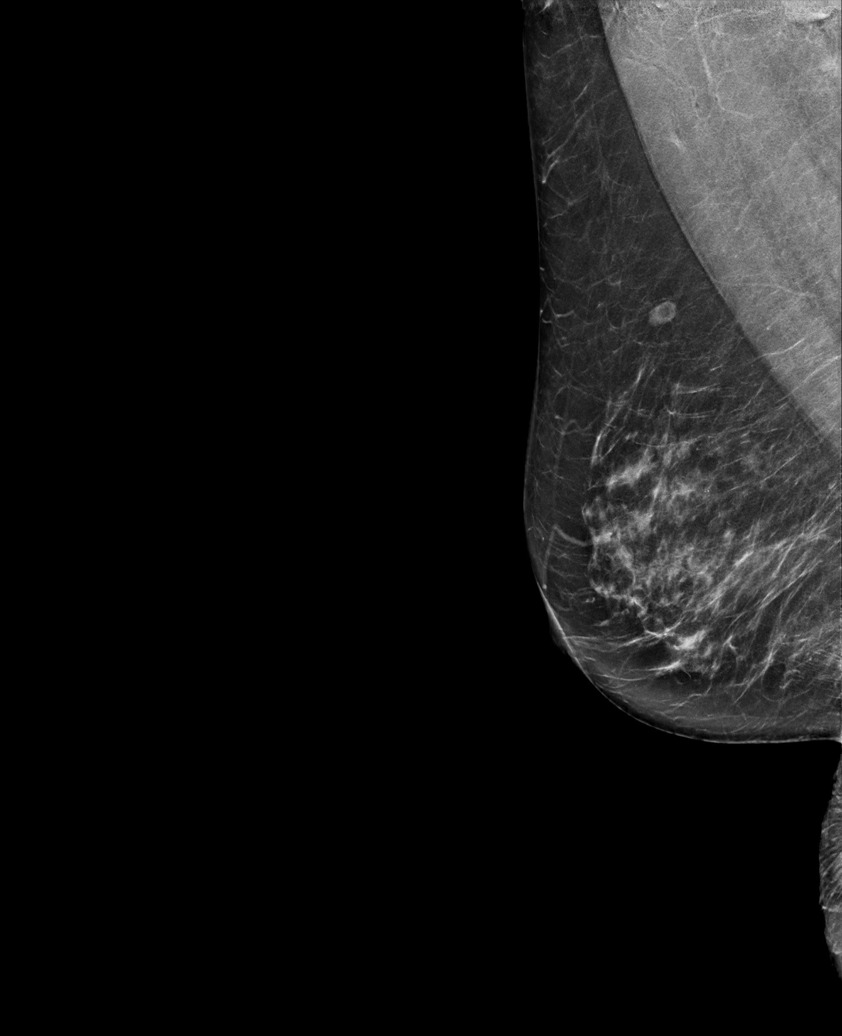

[L MLO tomo · tomo slice 35/68.0]
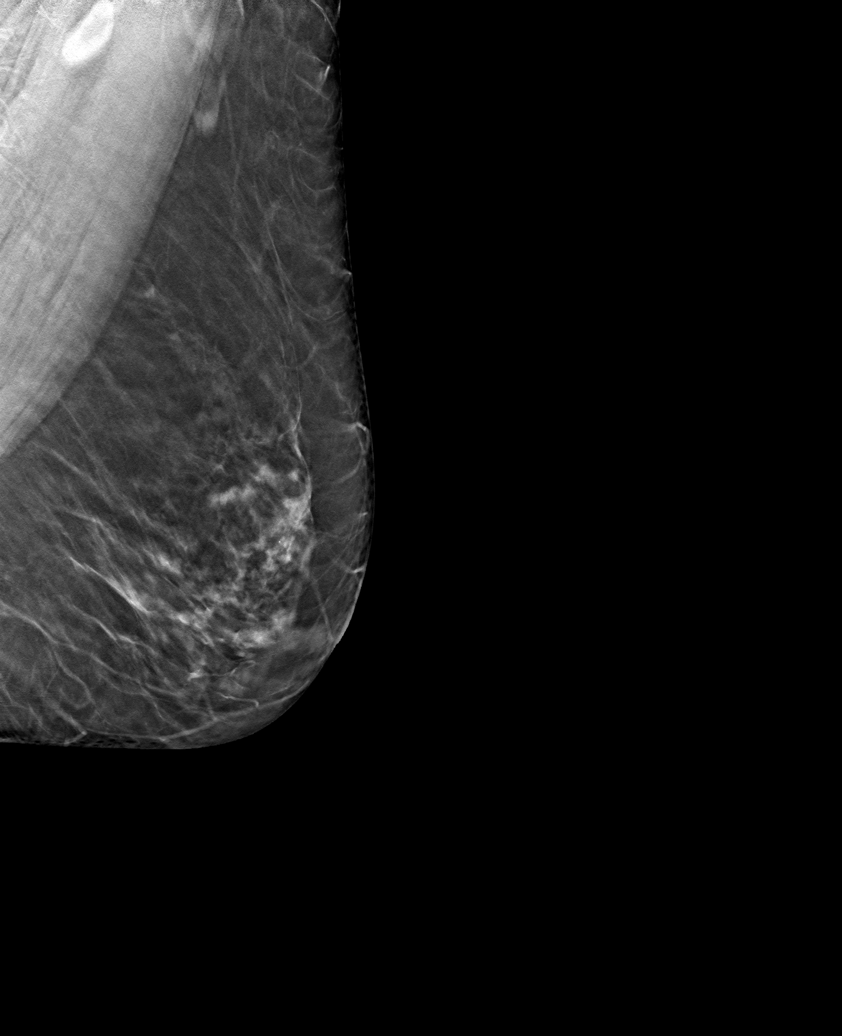

[6 of 30 positions shown; findings below may reference images not displayed]

ACR Breast Density Category b: There are scattered areas of
fibroglandular density.
FINDINGS: There are no findings suspicious for malignancy. Images were
processed with CAD.
IMPRESSION: No mammographic evidence of malignancy. A result letter of this
screening mammogram will be mailed directly to the patient.

RECOMMENDATION:
Screening mammogram in one year. (Code:[id number])

BI-RADS CATEGORY  1: Negative.

## 2020-02-29 DIAGNOSIS — F329 Major depressive disorder, single episode, unspecified: Secondary | ICD-10-CM | POA: Diagnosis not present

## 2020-02-29 DIAGNOSIS — M79604 Pain in right leg: Secondary | ICD-10-CM | POA: Diagnosis not present

## 2020-02-29 DIAGNOSIS — M25552 Pain in left hip: Secondary | ICD-10-CM | POA: Diagnosis not present

## 2020-02-29 DIAGNOSIS — I10 Essential (primary) hypertension: Secondary | ICD-10-CM | POA: Diagnosis not present

## 2020-02-29 DIAGNOSIS — R300 Dysuria: Secondary | ICD-10-CM | POA: Diagnosis not present

## 2020-02-29 DIAGNOSIS — M6281 Muscle weakness (generalized): Secondary | ICD-10-CM | POA: Diagnosis not present

## 2020-03-01 DIAGNOSIS — F3289 Other specified depressive episodes: Secondary | ICD-10-CM | POA: Diagnosis not present

## 2020-03-01 DIAGNOSIS — G4700 Insomnia, unspecified: Secondary | ICD-10-CM | POA: Diagnosis not present

## 2020-03-01 DIAGNOSIS — I10 Essential (primary) hypertension: Secondary | ICD-10-CM | POA: Diagnosis not present

## 2020-03-01 DIAGNOSIS — S82201D Unspecified fracture of shaft of right tibia, subsequent encounter for closed fracture with routine healing: Secondary | ICD-10-CM | POA: Diagnosis not present

## 2020-03-01 DIAGNOSIS — E7849 Other hyperlipidemia: Secondary | ICD-10-CM | POA: Diagnosis not present

## 2020-03-01 DIAGNOSIS — J45909 Unspecified asthma, uncomplicated: Secondary | ICD-10-CM | POA: Diagnosis not present

## 2020-03-05 DIAGNOSIS — F3289 Other specified depressive episodes: Secondary | ICD-10-CM | POA: Diagnosis not present

## 2020-03-05 DIAGNOSIS — S82201D Unspecified fracture of shaft of right tibia, subsequent encounter for closed fracture with routine healing: Secondary | ICD-10-CM | POA: Diagnosis not present

## 2020-03-05 DIAGNOSIS — I10 Essential (primary) hypertension: Secondary | ICD-10-CM | POA: Diagnosis not present

## 2020-03-05 DIAGNOSIS — J45909 Unspecified asthma, uncomplicated: Secondary | ICD-10-CM | POA: Diagnosis not present

## 2020-03-05 DIAGNOSIS — G4700 Insomnia, unspecified: Secondary | ICD-10-CM | POA: Diagnosis not present

## 2020-03-05 DIAGNOSIS — E7849 Other hyperlipidemia: Secondary | ICD-10-CM | POA: Diagnosis not present

## 2020-03-06 DIAGNOSIS — I10 Essential (primary) hypertension: Secondary | ICD-10-CM | POA: Diagnosis not present

## 2020-03-06 DIAGNOSIS — S82201D Unspecified fracture of shaft of right tibia, subsequent encounter for closed fracture with routine healing: Secondary | ICD-10-CM | POA: Diagnosis not present

## 2020-03-06 DIAGNOSIS — J45909 Unspecified asthma, uncomplicated: Secondary | ICD-10-CM | POA: Diagnosis not present

## 2020-03-06 DIAGNOSIS — E7849 Other hyperlipidemia: Secondary | ICD-10-CM | POA: Diagnosis not present

## 2020-03-06 DIAGNOSIS — F3289 Other specified depressive episodes: Secondary | ICD-10-CM | POA: Diagnosis not present

## 2020-03-06 DIAGNOSIS — G4700 Insomnia, unspecified: Secondary | ICD-10-CM | POA: Diagnosis not present

## 2020-03-07 DIAGNOSIS — E7849 Other hyperlipidemia: Secondary | ICD-10-CM | POA: Diagnosis not present

## 2020-03-07 DIAGNOSIS — J45909 Unspecified asthma, uncomplicated: Secondary | ICD-10-CM | POA: Diagnosis not present

## 2020-03-07 DIAGNOSIS — G4700 Insomnia, unspecified: Secondary | ICD-10-CM | POA: Diagnosis not present

## 2020-03-07 DIAGNOSIS — I10 Essential (primary) hypertension: Secondary | ICD-10-CM | POA: Diagnosis not present

## 2020-03-07 DIAGNOSIS — S82201D Unspecified fracture of shaft of right tibia, subsequent encounter for closed fracture with routine healing: Secondary | ICD-10-CM | POA: Diagnosis not present

## 2020-03-07 DIAGNOSIS — F3289 Other specified depressive episodes: Secondary | ICD-10-CM | POA: Diagnosis not present

## 2020-03-08 DIAGNOSIS — F3289 Other specified depressive episodes: Secondary | ICD-10-CM | POA: Diagnosis not present

## 2020-03-08 DIAGNOSIS — I10 Essential (primary) hypertension: Secondary | ICD-10-CM | POA: Diagnosis not present

## 2020-03-08 DIAGNOSIS — S82201D Unspecified fracture of shaft of right tibia, subsequent encounter for closed fracture with routine healing: Secondary | ICD-10-CM | POA: Diagnosis not present

## 2020-03-08 DIAGNOSIS — G4700 Insomnia, unspecified: Secondary | ICD-10-CM | POA: Diagnosis not present

## 2020-03-08 DIAGNOSIS — J45909 Unspecified asthma, uncomplicated: Secondary | ICD-10-CM | POA: Diagnosis not present

## 2020-03-08 DIAGNOSIS — E7849 Other hyperlipidemia: Secondary | ICD-10-CM | POA: Diagnosis not present

## 2020-03-13 DIAGNOSIS — E7849 Other hyperlipidemia: Secondary | ICD-10-CM | POA: Diagnosis not present

## 2020-03-13 DIAGNOSIS — S82201D Unspecified fracture of shaft of right tibia, subsequent encounter for closed fracture with routine healing: Secondary | ICD-10-CM | POA: Diagnosis not present

## 2020-03-13 DIAGNOSIS — F3289 Other specified depressive episodes: Secondary | ICD-10-CM | POA: Diagnosis not present

## 2020-03-13 DIAGNOSIS — I10 Essential (primary) hypertension: Secondary | ICD-10-CM | POA: Diagnosis not present

## 2020-03-13 DIAGNOSIS — G4700 Insomnia, unspecified: Secondary | ICD-10-CM | POA: Diagnosis not present

## 2020-03-13 DIAGNOSIS — J45909 Unspecified asthma, uncomplicated: Secondary | ICD-10-CM | POA: Diagnosis not present

## 2020-03-19 DIAGNOSIS — F5104 Psychophysiologic insomnia: Secondary | ICD-10-CM | POA: Diagnosis not present

## 2020-03-19 DIAGNOSIS — S82101D Unspecified fracture of upper end of right tibia, subsequent encounter for closed fracture with routine healing: Secondary | ICD-10-CM | POA: Diagnosis not present

## 2020-03-19 DIAGNOSIS — E663 Overweight: Secondary | ICD-10-CM | POA: Diagnosis not present

## 2020-03-19 DIAGNOSIS — M1612 Unilateral primary osteoarthritis, left hip: Secondary | ICD-10-CM | POA: Diagnosis not present

## 2020-03-19 DIAGNOSIS — Z6825 Body mass index (BMI) 25.0-25.9, adult: Secondary | ICD-10-CM | POA: Diagnosis not present

## 2020-03-20 DIAGNOSIS — E7849 Other hyperlipidemia: Secondary | ICD-10-CM | POA: Diagnosis not present

## 2020-03-20 DIAGNOSIS — S82201D Unspecified fracture of shaft of right tibia, subsequent encounter for closed fracture with routine healing: Secondary | ICD-10-CM | POA: Diagnosis not present

## 2020-03-20 DIAGNOSIS — G4700 Insomnia, unspecified: Secondary | ICD-10-CM | POA: Diagnosis not present

## 2020-03-20 DIAGNOSIS — I10 Essential (primary) hypertension: Secondary | ICD-10-CM | POA: Diagnosis not present

## 2020-03-20 DIAGNOSIS — F3289 Other specified depressive episodes: Secondary | ICD-10-CM | POA: Diagnosis not present

## 2020-03-20 DIAGNOSIS — J45909 Unspecified asthma, uncomplicated: Secondary | ICD-10-CM | POA: Diagnosis not present

## 2020-03-22 DIAGNOSIS — J45909 Unspecified asthma, uncomplicated: Secondary | ICD-10-CM | POA: Diagnosis not present

## 2020-03-22 DIAGNOSIS — E7849 Other hyperlipidemia: Secondary | ICD-10-CM | POA: Diagnosis not present

## 2020-03-22 DIAGNOSIS — I10 Essential (primary) hypertension: Secondary | ICD-10-CM | POA: Diagnosis not present

## 2020-03-22 DIAGNOSIS — S82201D Unspecified fracture of shaft of right tibia, subsequent encounter for closed fracture with routine healing: Secondary | ICD-10-CM | POA: Diagnosis not present

## 2020-03-22 DIAGNOSIS — G4700 Insomnia, unspecified: Secondary | ICD-10-CM | POA: Diagnosis not present

## 2020-03-22 DIAGNOSIS — F3289 Other specified depressive episodes: Secondary | ICD-10-CM | POA: Diagnosis not present

## 2020-03-23 DIAGNOSIS — G4700 Insomnia, unspecified: Secondary | ICD-10-CM | POA: Diagnosis not present

## 2020-03-23 DIAGNOSIS — E7849 Other hyperlipidemia: Secondary | ICD-10-CM | POA: Diagnosis not present

## 2020-03-23 DIAGNOSIS — J45909 Unspecified asthma, uncomplicated: Secondary | ICD-10-CM | POA: Diagnosis not present

## 2020-03-23 DIAGNOSIS — S82201D Unspecified fracture of shaft of right tibia, subsequent encounter for closed fracture with routine healing: Secondary | ICD-10-CM | POA: Diagnosis not present

## 2020-03-23 DIAGNOSIS — F3289 Other specified depressive episodes: Secondary | ICD-10-CM | POA: Diagnosis not present

## 2020-03-23 DIAGNOSIS — I10 Essential (primary) hypertension: Secondary | ICD-10-CM | POA: Diagnosis not present

## 2020-03-26 DIAGNOSIS — E7849 Other hyperlipidemia: Secondary | ICD-10-CM | POA: Diagnosis not present

## 2020-03-26 DIAGNOSIS — Z7951 Long term (current) use of inhaled steroids: Secondary | ICD-10-CM | POA: Diagnosis not present

## 2020-03-26 DIAGNOSIS — I10 Essential (primary) hypertension: Secondary | ICD-10-CM | POA: Diagnosis not present

## 2020-03-26 DIAGNOSIS — G4700 Insomnia, unspecified: Secondary | ICD-10-CM | POA: Diagnosis not present

## 2020-03-26 DIAGNOSIS — F3289 Other specified depressive episodes: Secondary | ICD-10-CM | POA: Diagnosis not present

## 2020-03-26 DIAGNOSIS — Z96651 Presence of right artificial knee joint: Secondary | ICD-10-CM | POA: Diagnosis not present

## 2020-03-26 DIAGNOSIS — S82201D Unspecified fracture of shaft of right tibia, subsequent encounter for closed fracture with routine healing: Secondary | ICD-10-CM | POA: Diagnosis not present

## 2020-03-26 DIAGNOSIS — Z9181 History of falling: Secondary | ICD-10-CM | POA: Diagnosis not present

## 2020-03-26 DIAGNOSIS — J45909 Unspecified asthma, uncomplicated: Secondary | ICD-10-CM | POA: Diagnosis not present

## 2020-03-29 DIAGNOSIS — M25551 Pain in right hip: Secondary | ICD-10-CM | POA: Diagnosis not present

## 2020-03-29 DIAGNOSIS — S82241A Displaced spiral fracture of shaft of right tibia, initial encounter for closed fracture: Secondary | ICD-10-CM | POA: Diagnosis not present

## 2020-03-29 DIAGNOSIS — Z0000 Encounter for general adult medical examination without abnormal findings: Secondary | ICD-10-CM | POA: Diagnosis not present

## 2020-03-29 DIAGNOSIS — M25552 Pain in left hip: Secondary | ICD-10-CM | POA: Diagnosis not present

## 2020-03-29 DIAGNOSIS — S82201D Unspecified fracture of shaft of right tibia, subsequent encounter for closed fracture with routine healing: Secondary | ICD-10-CM | POA: Diagnosis not present

## 2020-03-29 DIAGNOSIS — M25559 Pain in unspecified hip: Secondary | ICD-10-CM | POA: Diagnosis not present

## 2020-03-29 DIAGNOSIS — S82201A Unspecified fracture of shaft of right tibia, initial encounter for closed fracture: Secondary | ICD-10-CM | POA: Diagnosis not present

## 2020-03-29 DIAGNOSIS — S82221A Displaced transverse fracture of shaft of right tibia, initial encounter for closed fracture: Secondary | ICD-10-CM | POA: Diagnosis not present

## 2020-04-03 DIAGNOSIS — F3289 Other specified depressive episodes: Secondary | ICD-10-CM | POA: Diagnosis not present

## 2020-04-03 DIAGNOSIS — G4700 Insomnia, unspecified: Secondary | ICD-10-CM | POA: Diagnosis not present

## 2020-04-03 DIAGNOSIS — E7849 Other hyperlipidemia: Secondary | ICD-10-CM | POA: Diagnosis not present

## 2020-04-03 DIAGNOSIS — J45909 Unspecified asthma, uncomplicated: Secondary | ICD-10-CM | POA: Diagnosis not present

## 2020-04-03 DIAGNOSIS — I10 Essential (primary) hypertension: Secondary | ICD-10-CM | POA: Diagnosis not present

## 2020-04-03 DIAGNOSIS — S82201D Unspecified fracture of shaft of right tibia, subsequent encounter for closed fracture with routine healing: Secondary | ICD-10-CM | POA: Diagnosis not present

## 2020-04-06 ENCOUNTER — Other Ambulatory Visit: Payer: Self-pay | Admitting: Physician Assistant

## 2020-04-06 DIAGNOSIS — I10 Essential (primary) hypertension: Secondary | ICD-10-CM

## 2020-04-11 DIAGNOSIS — S82201D Unspecified fracture of shaft of right tibia, subsequent encounter for closed fracture with routine healing: Secondary | ICD-10-CM | POA: Diagnosis not present

## 2020-04-11 DIAGNOSIS — M25559 Pain in unspecified hip: Secondary | ICD-10-CM | POA: Diagnosis not present

## 2020-04-11 DIAGNOSIS — M87052 Idiopathic aseptic necrosis of left femur: Secondary | ICD-10-CM | POA: Diagnosis not present

## 2020-04-11 DIAGNOSIS — S82241A Displaced spiral fracture of shaft of right tibia, initial encounter for closed fracture: Secondary | ICD-10-CM | POA: Diagnosis not present

## 2020-04-11 DIAGNOSIS — Z01818 Encounter for other preprocedural examination: Secondary | ICD-10-CM | POA: Diagnosis not present

## 2020-04-11 DIAGNOSIS — S82221A Displaced transverse fracture of shaft of right tibia, initial encounter for closed fracture: Secondary | ICD-10-CM | POA: Diagnosis not present

## 2020-04-11 DIAGNOSIS — Z0000 Encounter for general adult medical examination without abnormal findings: Secondary | ICD-10-CM | POA: Diagnosis not present

## 2020-04-11 DIAGNOSIS — S82201A Unspecified fracture of shaft of right tibia, initial encounter for closed fracture: Secondary | ICD-10-CM | POA: Diagnosis not present

## 2020-04-12 DIAGNOSIS — S82201D Unspecified fracture of shaft of right tibia, subsequent encounter for closed fracture with routine healing: Secondary | ICD-10-CM | POA: Diagnosis not present

## 2020-04-12 DIAGNOSIS — E7849 Other hyperlipidemia: Secondary | ICD-10-CM | POA: Diagnosis not present

## 2020-04-12 DIAGNOSIS — G4700 Insomnia, unspecified: Secondary | ICD-10-CM | POA: Diagnosis not present

## 2020-04-12 DIAGNOSIS — I10 Essential (primary) hypertension: Secondary | ICD-10-CM | POA: Diagnosis not present

## 2020-04-12 DIAGNOSIS — J45909 Unspecified asthma, uncomplicated: Secondary | ICD-10-CM | POA: Diagnosis not present

## 2020-04-12 DIAGNOSIS — F3289 Other specified depressive episodes: Secondary | ICD-10-CM | POA: Diagnosis not present

## 2020-04-17 DIAGNOSIS — I10 Essential (primary) hypertension: Secondary | ICD-10-CM | POA: Diagnosis not present

## 2020-04-17 DIAGNOSIS — E7849 Other hyperlipidemia: Secondary | ICD-10-CM | POA: Diagnosis not present

## 2020-04-17 DIAGNOSIS — J45909 Unspecified asthma, uncomplicated: Secondary | ICD-10-CM | POA: Diagnosis not present

## 2020-04-17 DIAGNOSIS — G4700 Insomnia, unspecified: Secondary | ICD-10-CM | POA: Diagnosis not present

## 2020-04-17 DIAGNOSIS — S82201D Unspecified fracture of shaft of right tibia, subsequent encounter for closed fracture with routine healing: Secondary | ICD-10-CM | POA: Diagnosis not present

## 2020-04-17 DIAGNOSIS — F3289 Other specified depressive episodes: Secondary | ICD-10-CM | POA: Diagnosis not present

## 2020-04-19 ENCOUNTER — Other Ambulatory Visit: Payer: Self-pay | Admitting: Physician Assistant

## 2020-04-19 DIAGNOSIS — S82201D Unspecified fracture of shaft of right tibia, subsequent encounter for closed fracture with routine healing: Secondary | ICD-10-CM | POA: Diagnosis not present

## 2020-04-19 DIAGNOSIS — E7849 Other hyperlipidemia: Secondary | ICD-10-CM | POA: Diagnosis not present

## 2020-04-19 DIAGNOSIS — J45909 Unspecified asthma, uncomplicated: Secondary | ICD-10-CM | POA: Diagnosis not present

## 2020-04-19 DIAGNOSIS — F3289 Other specified depressive episodes: Secondary | ICD-10-CM | POA: Diagnosis not present

## 2020-04-19 DIAGNOSIS — F321 Major depressive disorder, single episode, moderate: Secondary | ICD-10-CM

## 2020-04-19 DIAGNOSIS — I10 Essential (primary) hypertension: Secondary | ICD-10-CM | POA: Diagnosis not present

## 2020-04-19 DIAGNOSIS — G4700 Insomnia, unspecified: Secondary | ICD-10-CM | POA: Diagnosis not present

## 2020-04-24 DIAGNOSIS — K219 Gastro-esophageal reflux disease without esophagitis: Secondary | ICD-10-CM | POA: Diagnosis not present

## 2020-04-24 DIAGNOSIS — E663 Overweight: Secondary | ICD-10-CM | POA: Diagnosis not present

## 2020-04-24 DIAGNOSIS — M1612 Unilateral primary osteoarthritis, left hip: Secondary | ICD-10-CM | POA: Diagnosis not present

## 2020-04-24 DIAGNOSIS — Z23 Encounter for immunization: Secondary | ICD-10-CM | POA: Diagnosis not present

## 2020-04-24 DIAGNOSIS — Z01818 Encounter for other preprocedural examination: Secondary | ICD-10-CM | POA: Diagnosis not present

## 2020-04-24 DIAGNOSIS — Z6825 Body mass index (BMI) 25.0-25.9, adult: Secondary | ICD-10-CM | POA: Diagnosis not present

## 2020-05-13 ENCOUNTER — Other Ambulatory Visit: Payer: Self-pay | Admitting: Physician Assistant

## 2020-05-13 DIAGNOSIS — M545 Low back pain, unspecified: Secondary | ICD-10-CM

## 2020-06-14 DIAGNOSIS — Z01812 Encounter for preprocedural laboratory examination: Secondary | ICD-10-CM | POA: Diagnosis not present

## 2020-06-14 DIAGNOSIS — M87052 Idiopathic aseptic necrosis of left femur: Secondary | ICD-10-CM | POA: Diagnosis not present

## 2020-06-14 DIAGNOSIS — Z01810 Encounter for preprocedural cardiovascular examination: Secondary | ICD-10-CM | POA: Diagnosis not present

## 2020-06-14 DIAGNOSIS — I10 Essential (primary) hypertension: Secondary | ICD-10-CM | POA: Diagnosis not present

## 2020-06-28 ENCOUNTER — Other Ambulatory Visit: Payer: Self-pay | Admitting: Physician Assistant

## 2020-06-28 DIAGNOSIS — I10 Essential (primary) hypertension: Secondary | ICD-10-CM

## 2020-07-01 DIAGNOSIS — Z20822 Contact with and (suspected) exposure to covid-19: Secondary | ICD-10-CM | POA: Diagnosis not present

## 2020-07-01 DIAGNOSIS — M87052 Idiopathic aseptic necrosis of left femur: Secondary | ICD-10-CM | POA: Diagnosis not present

## 2020-07-01 DIAGNOSIS — Z01818 Encounter for other preprocedural examination: Secondary | ICD-10-CM | POA: Diagnosis not present

## 2020-07-06 DIAGNOSIS — J029 Acute pharyngitis, unspecified: Secondary | ICD-10-CM | POA: Diagnosis not present

## 2020-07-06 DIAGNOSIS — J101 Influenza due to other identified influenza virus with other respiratory manifestations: Secondary | ICD-10-CM | POA: Diagnosis not present

## 2020-07-06 DIAGNOSIS — R350 Frequency of micturition: Secondary | ICD-10-CM | POA: Diagnosis not present

## 2020-07-13 DIAGNOSIS — J90 Pleural effusion, not elsewhere classified: Secondary | ICD-10-CM | POA: Diagnosis not present

## 2020-07-13 DIAGNOSIS — R1011 Right upper quadrant pain: Secondary | ICD-10-CM | POA: Diagnosis not present

## 2020-07-13 DIAGNOSIS — R935 Abnormal findings on diagnostic imaging of other abdominal regions, including retroperitoneum: Secondary | ICD-10-CM | POA: Diagnosis not present

## 2020-07-13 DIAGNOSIS — Z20822 Contact with and (suspected) exposure to covid-19: Secondary | ICD-10-CM | POA: Diagnosis not present

## 2020-07-13 DIAGNOSIS — D75839 Thrombocytosis, unspecified: Secondary | ICD-10-CM | POA: Diagnosis not present

## 2020-07-13 DIAGNOSIS — K219 Gastro-esophageal reflux disease without esophagitis: Secondary | ICD-10-CM | POA: Diagnosis not present

## 2020-07-13 DIAGNOSIS — E876 Hypokalemia: Secondary | ICD-10-CM | POA: Diagnosis not present

## 2020-07-13 DIAGNOSIS — R911 Solitary pulmonary nodule: Secondary | ICD-10-CM | POA: Diagnosis not present

## 2020-07-13 DIAGNOSIS — D72829 Elevated white blood cell count, unspecified: Secondary | ICD-10-CM | POA: Diagnosis not present

## 2020-07-13 DIAGNOSIS — K8020 Calculus of gallbladder without cholecystitis without obstruction: Secondary | ICD-10-CM | POA: Diagnosis not present

## 2020-07-13 DIAGNOSIS — Z8709 Personal history of other diseases of the respiratory system: Secondary | ICD-10-CM | POA: Diagnosis not present

## 2020-07-13 DIAGNOSIS — R079 Chest pain, unspecified: Secondary | ICD-10-CM | POA: Diagnosis not present

## 2020-07-13 DIAGNOSIS — E279 Disorder of adrenal gland, unspecified: Secondary | ICD-10-CM | POA: Diagnosis not present

## 2020-07-13 DIAGNOSIS — F32A Depression, unspecified: Secondary | ICD-10-CM | POA: Diagnosis not present

## 2020-07-13 DIAGNOSIS — Z8249 Family history of ischemic heart disease and other diseases of the circulatory system: Secondary | ICD-10-CM | POA: Diagnosis not present

## 2020-07-13 DIAGNOSIS — Z87440 Personal history of urinary (tract) infections: Secondary | ICD-10-CM | POA: Diagnosis not present

## 2020-07-13 DIAGNOSIS — J45909 Unspecified asthma, uncomplicated: Secondary | ICD-10-CM | POA: Diagnosis not present

## 2020-07-13 DIAGNOSIS — D3001 Benign neoplasm of right kidney: Secondary | ICD-10-CM | POA: Diagnosis not present

## 2020-07-13 DIAGNOSIS — I10 Essential (primary) hypertension: Secondary | ICD-10-CM | POA: Diagnosis not present

## 2020-07-13 DIAGNOSIS — L8993 Pressure ulcer of unspecified site, stage 3: Secondary | ICD-10-CM | POA: Diagnosis not present

## 2020-07-13 DIAGNOSIS — M169 Osteoarthritis of hip, unspecified: Secondary | ICD-10-CM | POA: Diagnosis not present

## 2020-07-13 DIAGNOSIS — D509 Iron deficiency anemia, unspecified: Secondary | ICD-10-CM | POA: Diagnosis not present

## 2020-07-13 DIAGNOSIS — R0602 Shortness of breath: Secondary | ICD-10-CM | POA: Diagnosis not present

## 2020-07-17 DIAGNOSIS — Z6825 Body mass index (BMI) 25.0-25.9, adult: Secondary | ICD-10-CM | POA: Diagnosis not present

## 2020-07-17 DIAGNOSIS — B370 Candidal stomatitis: Secondary | ICD-10-CM | POA: Diagnosis not present

## 2020-07-17 DIAGNOSIS — E663 Overweight: Secondary | ICD-10-CM | POA: Diagnosis not present

## 2020-07-17 DIAGNOSIS — R079 Chest pain, unspecified: Secondary | ICD-10-CM | POA: Diagnosis not present

## 2020-07-17 DIAGNOSIS — D696 Thrombocytopenia, unspecified: Secondary | ICD-10-CM | POA: Diagnosis not present

## 2020-07-17 DIAGNOSIS — E876 Hypokalemia: Secondary | ICD-10-CM | POA: Diagnosis not present

## 2020-07-17 DIAGNOSIS — Z79891 Long term (current) use of opiate analgesic: Secondary | ICD-10-CM | POA: Diagnosis not present

## 2020-07-17 DIAGNOSIS — M87852 Other osteonecrosis, left femur: Secondary | ICD-10-CM | POA: Diagnosis not present

## 2020-07-21 ENCOUNTER — Other Ambulatory Visit: Payer: Self-pay | Admitting: Physician Assistant

## 2020-07-21 DIAGNOSIS — J4520 Mild intermittent asthma, uncomplicated: Secondary | ICD-10-CM

## 2020-07-21 DIAGNOSIS — I10 Essential (primary) hypertension: Secondary | ICD-10-CM

## 2020-07-21 DIAGNOSIS — J302 Other seasonal allergic rhinitis: Secondary | ICD-10-CM

## 2020-07-21 DIAGNOSIS — K219 Gastro-esophageal reflux disease without esophagitis: Secondary | ICD-10-CM

## 2020-08-11 ENCOUNTER — Other Ambulatory Visit: Payer: Self-pay | Admitting: Physician Assistant

## 2020-08-11 DIAGNOSIS — M545 Low back pain, unspecified: Secondary | ICD-10-CM

## 2020-08-18 ENCOUNTER — Other Ambulatory Visit: Payer: Self-pay | Admitting: Physician Assistant

## 2020-08-18 DIAGNOSIS — I10 Essential (primary) hypertension: Secondary | ICD-10-CM

## 2020-08-24 ENCOUNTER — Other Ambulatory Visit: Payer: Self-pay | Admitting: Physician Assistant

## 2020-08-24 DIAGNOSIS — J4520 Mild intermittent asthma, uncomplicated: Secondary | ICD-10-CM

## 2020-08-24 DIAGNOSIS — K219 Gastro-esophageal reflux disease without esophagitis: Secondary | ICD-10-CM

## 2020-08-24 DIAGNOSIS — J302 Other seasonal allergic rhinitis: Secondary | ICD-10-CM

## 2020-11-02 ENCOUNTER — Other Ambulatory Visit: Payer: Self-pay | Admitting: Physician Assistant

## 2020-11-02 DIAGNOSIS — F321 Major depressive disorder, single episode, moderate: Secondary | ICD-10-CM

## 2020-11-02 DIAGNOSIS — M545 Low back pain, unspecified: Secondary | ICD-10-CM

## 2020-11-25 ENCOUNTER — Other Ambulatory Visit: Payer: Self-pay | Admitting: Physician Assistant

## 2020-11-25 DIAGNOSIS — F321 Major depressive disorder, single episode, moderate: Secondary | ICD-10-CM

## 2020-12-02 ENCOUNTER — Other Ambulatory Visit: Payer: Self-pay | Admitting: Physician Assistant

## 2020-12-02 DIAGNOSIS — M545 Low back pain, unspecified: Secondary | ICD-10-CM
